# Patient Record
Sex: Female | Born: 1955 | Race: Asian | Hispanic: No | Smoking: Never smoker
Health system: Southern US, Community
[De-identification: ages and names within clinical notes are randomized; demographics above are authoritative.]

## PROBLEM LIST (undated history)

## (undated) DIAGNOSIS — N19 Unspecified kidney failure: Secondary | ICD-10-CM

## (undated) DIAGNOSIS — J302 Other seasonal allergic rhinitis: Secondary | ICD-10-CM

## (undated) DIAGNOSIS — E039 Hypothyroidism, unspecified: Secondary | ICD-10-CM

## (undated) DIAGNOSIS — M1990 Unspecified osteoarthritis, unspecified site: Secondary | ICD-10-CM

## (undated) DIAGNOSIS — E119 Type 2 diabetes mellitus without complications: Secondary | ICD-10-CM

## (undated) DIAGNOSIS — I10 Essential (primary) hypertension: Secondary | ICD-10-CM

## (undated) DIAGNOSIS — K219 Gastro-esophageal reflux disease without esophagitis: Secondary | ICD-10-CM

## (undated) DIAGNOSIS — G709 Myoneural disorder, unspecified: Secondary | ICD-10-CM

## (undated) HISTORY — DX: Unspecified kidney failure: N19

## (undated) HISTORY — PX: CHOLECYSTECTOMY: SHX55

---

## 2011-11-26 ENCOUNTER — Ambulatory Visit: Payer: Medicaid Other | Attending: Sports Medicine | Admitting: Physical Therapy

## 2011-11-26 DIAGNOSIS — IMO0001 Reserved for inherently not codable concepts without codable children: Secondary | ICD-10-CM | POA: Insufficient documentation

## 2011-11-26 DIAGNOSIS — M25569 Pain in unspecified knee: Secondary | ICD-10-CM | POA: Insufficient documentation

## 2011-11-26 DIAGNOSIS — R269 Unspecified abnormalities of gait and mobility: Secondary | ICD-10-CM | POA: Insufficient documentation

## 2011-11-26 DIAGNOSIS — M25669 Stiffness of unspecified knee, not elsewhere classified: Secondary | ICD-10-CM | POA: Insufficient documentation

## 2011-12-04 ENCOUNTER — Ambulatory Visit: Payer: Medicaid Other | Admitting: Physical Therapy

## 2011-12-04 ENCOUNTER — Ambulatory Visit: Payer: Medicaid Other | Attending: Sports Medicine | Admitting: Physical Therapy

## 2011-12-04 DIAGNOSIS — IMO0001 Reserved for inherently not codable concepts without codable children: Secondary | ICD-10-CM | POA: Insufficient documentation

## 2011-12-04 DIAGNOSIS — R269 Unspecified abnormalities of gait and mobility: Secondary | ICD-10-CM | POA: Insufficient documentation

## 2011-12-04 DIAGNOSIS — M25569 Pain in unspecified knee: Secondary | ICD-10-CM | POA: Insufficient documentation

## 2011-12-04 DIAGNOSIS — M25669 Stiffness of unspecified knee, not elsewhere classified: Secondary | ICD-10-CM | POA: Insufficient documentation

## 2012-08-25 ENCOUNTER — Other Ambulatory Visit (HOSPITAL_COMMUNITY): Payer: Self-pay | Admitting: Family Medicine

## 2012-08-25 ENCOUNTER — Ambulatory Visit (HOSPITAL_COMMUNITY)
Admission: RE | Admit: 2012-08-25 | Discharge: 2012-08-25 | Disposition: A | Discharge status: Home / Self Care | Payer: Medicaid Other | Source: Ambulatory Visit | Attending: Family Medicine | Admitting: Family Medicine

## 2012-08-25 DIAGNOSIS — R9431 Abnormal electrocardiogram [ECG] [EKG]: Secondary | ICD-10-CM

## 2012-08-25 DIAGNOSIS — I369 Nonrheumatic tricuspid valve disorder, unspecified: Secondary | ICD-10-CM

## 2012-08-25 NOTE — Progress Notes (Signed)
  Echocardiogram 2D Echocardiogram has been performed.  Evelyn Anderson 08/25/2012, 4:12 PM

## 2012-09-16 ENCOUNTER — Ambulatory Visit (HOSPITAL_COMMUNITY)
Admission: RE | Admit: 2012-09-16 | Discharge: 2012-09-16 | Disposition: A | Discharge status: Home / Self Care | Payer: Medicaid Other | Source: Ambulatory Visit | Attending: Orthopedic Surgery | Admitting: Orthopedic Surgery

## 2012-09-16 ENCOUNTER — Encounter (HOSPITAL_COMMUNITY)
Admission: RE | Admit: 2012-09-16 | Discharge: 2012-09-16 | Disposition: A | Discharge status: Home / Self Care | Payer: Medicaid Other | Source: Ambulatory Visit | Attending: Orthopedic Surgery | Admitting: Orthopedic Surgery

## 2012-09-16 ENCOUNTER — Encounter (HOSPITAL_COMMUNITY): Payer: Self-pay

## 2012-09-16 ENCOUNTER — Encounter (HOSPITAL_COMMUNITY): Payer: Self-pay | Admitting: Pharmacy Technician

## 2012-09-16 DIAGNOSIS — Z01818 Encounter for other preprocedural examination: Secondary | ICD-10-CM | POA: Insufficient documentation

## 2012-09-16 DIAGNOSIS — Z01812 Encounter for preprocedural laboratory examination: Secondary | ICD-10-CM | POA: Insufficient documentation

## 2012-09-16 DIAGNOSIS — I10 Essential (primary) hypertension: Secondary | ICD-10-CM | POA: Insufficient documentation

## 2012-09-16 HISTORY — DX: Gastro-esophageal reflux disease without esophagitis: K21.9

## 2012-09-16 HISTORY — DX: Unspecified osteoarthritis, unspecified site: M19.90

## 2012-09-16 HISTORY — DX: Hypothyroidism, unspecified: E03.9

## 2012-09-16 HISTORY — DX: Myoneural disorder, unspecified: G70.9

## 2012-09-16 HISTORY — DX: Other seasonal allergic rhinitis: J30.2

## 2012-09-16 HISTORY — DX: Essential (primary) hypertension: I10

## 2012-09-16 HISTORY — DX: Type 2 diabetes mellitus without complications: E11.9

## 2012-09-16 LAB — URINALYSIS, ROUTINE W REFLEX MICROSCOPIC
Bilirubin Urine: NEGATIVE
Ketones, ur: NEGATIVE mg/dL
Protein, ur: 100 mg/dL — AB
Specific Gravity, Urine: 1.005 (ref 1.005–1.030)
Urobilinogen, UA: 0.2 mg/dL (ref 0.0–1.0)

## 2012-09-16 LAB — APTT: aPTT: 28 seconds (ref 24–37)

## 2012-09-16 LAB — CBC
HCT: 32.8 % — ABNORMAL LOW (ref 36.0–46.0)
Hemoglobin: 11.4 g/dL — ABNORMAL LOW (ref 12.0–15.0)
MCV: 80.2 fL (ref 78.0–100.0)
Platelets: 267 10*3/uL (ref 150–400)
RBC: 4.09 MIL/uL (ref 3.87–5.11)
WBC: 9.6 10*3/uL (ref 4.0–10.5)

## 2012-09-16 LAB — BASIC METABOLIC PANEL
CO2: 27 mEq/L (ref 19–32)
Calcium: 9.6 mg/dL (ref 8.4–10.5)
Chloride: 96 mEq/L (ref 96–112)
Potassium: 3.3 mEq/L — ABNORMAL LOW (ref 3.5–5.1)
Sodium: 133 mEq/L — ABNORMAL LOW (ref 135–145)

## 2012-09-16 LAB — URINE MICROSCOPIC-ADD ON

## 2012-09-16 LAB — SURGICAL PCR SCREEN: Staphylococcus aureus: NEGATIVE

## 2012-09-16 NOTE — Progress Notes (Signed)
Daughter, Evelyn Anderson  [phone] present for interview this am and interpretor from [employer] as noted. Clear liquid sheet instructions given to daughter with instructions NO MILK PRODUCTS that AM, also if feels blood sugar is low that AM then come to hospital. Spoke with daughter at 1200 who stated her mother is eating lunch now and "feels fine".  Clydie Braun from Dr Boneta Lucks called and stated Dr Charlann Boxer os OK with daughter being interpretor day of surgery.  Faxed u/a with micro, bmet to Dr Charlann Boxer through [facility]

## 2012-09-16 NOTE — Progress Notes (Signed)
OV DR Clarisa Schools on chart 08/24/12

## 2012-09-16 NOTE — Progress Notes (Signed)
CBC, CMET, EKG, clearance with note Dr Evelene Croon 08/24/12 chart, eccho 4/14 chart

## 2012-09-16 NOTE — Patient Instructions (Addendum)
20 Evelyn Anderson  09/16/2012   Your procedure is scheduled on:  09/23/12  Wednesday  Report to [facility] at  1030     AM.  Call this number if you have problems the morning of surgery: [phone]       Remember: TAKE ONE HALF DOSAGE INSULIN Tuesday NIGHT.    EAT A SNACK BEFORE BEDTIME OR MIDNIGHT Tuesday NIGHT  Do not eat food  :After Midnight. Tuesday NIGHT.  May have clear liquids Wednesday MORNING UNTIL 0600, then nothing by mouth   Take these medicines the morning of surgery with A SIP OF WATER: Zantac                        MAY TAKE NORCO(pain medicine) if needed   DO NOT TAKE ANY BLOOD SUGAR MEDICATION Wednesday MORNING  Contacts, dentures or partial plates can not be worn to surgery  Leave suitcase in the car. After surgery it may be brought to your room.  For patients admitted to the hospital, checkout time is 11:00 AM day of  discharge.             SPECIAL INSTRUCTIONS- SEE [place] PREPARING FOR SURGERY INSTRUCTION SHEET-     DO NOT WEAR JEWELRY, LOTIONS, POWDERS, OR PERFUMES.  WOMEN-- DO NOT SHAVE LEGS OR UNDERARMS FOR 12 HOURS BEFORE SHOWERS. MEN MAY SHAVE FACE.  Patients discharged the day of surgery will not be allowed to drive home. IF going home the day of surgery, you must have a driver and someone to stay with you for the first 24 hours  Name and phone number of your driver:      admission                                                                  Please read over the following fact sheets that you were given: MRSA Information, Incentive Spirometry Sheet, Blood Transfusion Sheet  Information                                                                                   Garnett Nunziata  PST [phone]  [phone]                 FAILURE TO FOLLOW THESE INSTRUCTIONS MAY RESULT IN  CANCELLATION   OF YOUR SURGERY                                                  Patient Signature _____________________________

## 2012-09-17 NOTE — Progress Notes (Signed)
Received fax from Dr Boneta Lucks office that Cipro had been called in to drug store

## 2012-09-20 NOTE — H&P (Signed)
TOTAL KNEE ADMISSION H&P  Patient is being admitted for left total knee arthroplasty.  Subjective:  Chief Complaint:   Left knee OA / pain.  HPI: Evelyn Anderson, 57 y.o. female, has a history of pain and functional disability in the left knee due to arthritis and has failed non-surgical conservative treatments for greater than 12 weeks to includeNSAID's and/or analgesics, corticosteriod injections, use of assistive devices and activity modification.  Onset of symptoms was gradual, starting 2 years ago with gradually worsening course since that time. The patient noted no past surgery on the left knee(s).  Patient currently rates pain in the left knee(s) at 8 out of 10 with activity. Patient has worsening of pain with activity and weight bearing, pain that interferes with activities of daily living, pain with passive range of motion, crepitus and joint swelling.  Patient has evidence of periarticular osteophytes and joint space narrowing by imaging studies. There is no active infection. Risks, benefits and expectations were discussed with the patient. Patient understand the risks, benefits and expectations and wishes to proceed with surgery.   D/C Plans:   Home with HHPT/SNF  Post-op Meds:   No Rx given   Tranexamic Acid:   To be given  Decadron:    Not to be given - DM  FYI:   ASA post-op    Past Medical History  Diagnosis Date  . Hypertension   . Hypothyroidism   . Diabetes mellitus without complication   . Seasonal allergies   . GERD (gastroesophageal reflux disease)   . Arthritis   . Neuromuscular disorder     peripheral neuropathy    Past Surgical History  Procedure Laterality Date  . Cholecystectomy      No prescriptions prior to admission   Allergies  Allergen Reactions  . Other     Cant digest glutin    History  Substance Use Topics  . Smoking status: Never Smoker   . Smokeless tobacco: Never Used  . Alcohol Use: No    No family history on file.   Review of  Systems  Constitutional: Negative.   HENT: Negative.   Eyes: Negative.   Respiratory: Negative.   Cardiovascular: Negative.   Gastrointestinal: Positive for heartburn.  Genitourinary: Negative.   Musculoskeletal: Positive for joint pain.  Skin: Negative.   Neurological: Positive for sensory change.  Endo/Heme/Allergies: Positive for environmental allergies.  Psychiatric/Behavioral: Negative.     Objective:  Physical Exam  Constitutional: She is oriented to person, place, and time. She appears well-developed and well-nourished.  HENT:  Head: Normocephalic and atraumatic.  Mouth/Throat: Oropharynx is clear and moist.  Eyes: Pupils are equal, round, and reactive to light.  Neck: Neck supple. No JVD present. No tracheal deviation present. No thyromegaly present.  Cardiovascular: Normal rate, regular rhythm, normal heart sounds and intact distal pulses.   Respiratory: Effort normal and breath sounds normal. No stridor. No respiratory distress. She has no wheezes.  GI: Soft. There is no tenderness. There is no guarding.  Musculoskeletal:       Left knee: She exhibits decreased range of motion, swelling and bony tenderness. She exhibits no effusion, no ecchymosis, no laceration and no erythema. Tenderness found.  Lymphadenopathy:    She has no cervical adenopathy.  Neurological: She is alert and oriented to person, place, and time.  Skin: Skin is warm and dry.  Psychiatric: She has a normal mood and affect.    Imaging Review Plain radiographs demonstrate severe degenerative joint disease of the left knee(s).  The overall alignment is neutral. The bone quality appears to be good for age and reported activity level.  Assessment/Plan:  End stage arthritis, left knee   The patient history, physical examination, clinical judgment of the provider and imaging studies are consistent with end stage degenerative joint disease of the left knee(s) and total knee arthroplasty is deemed  medically necessary. The treatment options including medical management, injection therapy arthroscopy and arthroplasty were discussed at length. The risks and benefits of total knee arthroplasty were presented and reviewed. The risks due to aseptic loosening, infection, stiffness, patella tracking problems, thromboembolic complications and other imponderables were discussed. The patient acknowledged the explanation, agreed to proceed with the plan and consent was signed. Patient is being admitted for inpatient treatment for surgery, pain control, PT, OT, prophylactic antibiotics, VTE prophylaxis, progressive ambulation and ADL's and discharge planning. The patient is planning to be discharged home with home health services.    Anastasio Auerbach Monnie Anspach   PAC  09/20/2012, 10:21 AM

## 2012-09-23 ENCOUNTER — Inpatient Hospital Stay (HOSPITAL_COMMUNITY)
Admission: RE | Admit: 2012-09-23 | Discharge: 2012-09-26 | DRG: 470 | Disposition: A | Discharge status: Skilled Nursing Facility | Payer: Medicaid Other | Source: Ambulatory Visit | Attending: Orthopedic Surgery | Admitting: Orthopedic Surgery

## 2012-09-23 ENCOUNTER — Encounter (HOSPITAL_COMMUNITY)
Admission: RE | Disposition: A | Discharge status: Skilled Nursing Facility | Payer: Self-pay | Source: Ambulatory Visit | Attending: Orthopedic Surgery

## 2012-09-23 ENCOUNTER — Encounter (HOSPITAL_COMMUNITY): Payer: Self-pay | Admitting: General Practice

## 2012-09-23 ENCOUNTER — Inpatient Hospital Stay (HOSPITAL_COMMUNITY): Payer: Medicaid Other | Admitting: Anesthesiology

## 2012-09-23 ENCOUNTER — Encounter (HOSPITAL_COMMUNITY): Payer: Self-pay | Admitting: Anesthesiology

## 2012-09-23 DIAGNOSIS — Z96652 Presence of left artificial knee joint: Secondary | ICD-10-CM

## 2012-09-23 DIAGNOSIS — M1710 Unilateral primary osteoarthritis, unspecified knee: Principal | ICD-10-CM | POA: Diagnosis present

## 2012-09-23 DIAGNOSIS — D62 Acute posthemorrhagic anemia: Secondary | ICD-10-CM | POA: Diagnosis not present

## 2012-09-23 DIAGNOSIS — K219 Gastro-esophageal reflux disease without esophagitis: Secondary | ICD-10-CM | POA: Diagnosis present

## 2012-09-23 DIAGNOSIS — I10 Essential (primary) hypertension: Secondary | ICD-10-CM | POA: Diagnosis present

## 2012-09-23 DIAGNOSIS — E119 Type 2 diabetes mellitus without complications: Secondary | ICD-10-CM | POA: Diagnosis present

## 2012-09-23 DIAGNOSIS — E669 Obesity, unspecified: Secondary | ICD-10-CM | POA: Diagnosis present

## 2012-09-23 DIAGNOSIS — Z96659 Presence of unspecified artificial knee joint: Secondary | ICD-10-CM

## 2012-09-23 DIAGNOSIS — E039 Hypothyroidism, unspecified: Secondary | ICD-10-CM | POA: Diagnosis present

## 2012-09-23 DIAGNOSIS — D500 Iron deficiency anemia secondary to blood loss (chronic): Secondary | ICD-10-CM | POA: Diagnosis not present

## 2012-09-23 HISTORY — PX: TOTAL KNEE ARTHROPLASTY: SHX125

## 2012-09-23 LAB — GLUCOSE, CAPILLARY
Glucose-Capillary: 172 mg/dL — ABNORMAL HIGH (ref 70–99)
Glucose-Capillary: 188 mg/dL — ABNORMAL HIGH (ref 70–99)

## 2012-09-23 LAB — TYPE AND SCREEN
ABO/RH(D): B POS
Antibody Screen: NEGATIVE

## 2012-09-23 SURGERY — ARTHROPLASTY, KNEE, TOTAL
Anesthesia: General | Site: Knee | Laterality: Left | Wound class: Clean

## 2012-09-23 MED ORDER — BACID PO TABS
1.0000 | ORAL_TABLET | Freq: Three times a day (TID) | ORAL | Status: DC
  Administered 2012-09-24 (×3): 1 via ORAL
  Filled 2012-09-23 (×8): qty 1

## 2012-09-23 MED ORDER — LINAGLIPTIN 5 MG PO TABS
5.0000 mg | ORAL_TABLET | Freq: Every day | ORAL | Status: DC
  Administered 2012-09-24 – 2012-09-26 (×2): 5 mg via ORAL
  Filled 2012-09-23 (×5): qty 1

## 2012-09-23 MED ORDER — LOSARTAN POTASSIUM-HCTZ 50-12.5 MG PO TABS: 1.0000 | ORAL_TABLET | Freq: Every day | ORAL | Status: DC

## 2012-09-23 MED ORDER — HYDROMORPHONE HCL PF 1 MG/ML IJ SOLN
0.5000 mg | INTRAMUSCULAR | Status: DC | PRN
  Filled 2012-09-23: qty 1

## 2012-09-23 MED ORDER — HYDROCHLOROTHIAZIDE 12.5 MG PO CAPS
12.5000 mg | ORAL_CAPSULE | Freq: Every day | ORAL | Status: DC
  Administered 2012-09-24 – 2012-09-26 (×3): 12.5 mg via ORAL
  Filled 2012-09-23 (×3): qty 1

## 2012-09-23 MED ORDER — PROMETHAZINE HCL 25 MG/ML IJ SOLN: 6.2500 mg | INTRAMUSCULAR | Status: DC | PRN

## 2012-09-23 MED ORDER — FAMOTIDINE 40 MG PO TABS
40.0000 mg | ORAL_TABLET | Freq: Every day | ORAL | Status: DC
  Administered 2012-09-24 – 2012-09-25 (×2): 40 mg via ORAL
  Filled 2012-09-23 (×4): qty 1

## 2012-09-23 MED ORDER — ONDANSETRON HCL 4 MG PO TABS
4.0000 mg | ORAL_TABLET | Freq: Four times a day (QID) | ORAL | Status: DC | PRN
  Administered 2012-09-24: 4 mg via ORAL
  Filled 2012-09-23: qty 1

## 2012-09-23 MED ORDER — MIDAZOLAM HCL 5 MG/5ML IJ SOLN
INTRAMUSCULAR | Status: DC | PRN
  Administered 2012-09-23: 1 mg via INTRAVENOUS
  Administered 2012-09-23: .5 mg via INTRAVENOUS

## 2012-09-23 MED ORDER — PHENOL 1.4 % MT LIQD: 1.0000 | OROMUCOSAL | Status: DC | PRN

## 2012-09-23 MED ORDER — ACETAMINOPHEN 10 MG/ML IV SOLN
INTRAVENOUS | Status: DC | PRN
  Administered 2012-09-23: 1000 mg via INTRAVENOUS

## 2012-09-23 MED ORDER — ASPIRIN EC 325 MG PO TBEC
325.0000 mg | DELAYED_RELEASE_TABLET | Freq: Two times a day (BID) | ORAL | Status: DC
  Administered 2012-09-24 – 2012-09-26 (×5): 325 mg via ORAL
  Filled 2012-09-23 (×7): qty 1

## 2012-09-23 MED ORDER — DIPHENHYDRAMINE HCL 25 MG PO CAPS
25.0000 mg | ORAL_CAPSULE | Freq: Four times a day (QID) | ORAL | Status: DC | PRN
  Administered 2012-09-25: 25 mg via ORAL
  Filled 2012-09-23: qty 1

## 2012-09-23 MED ORDER — KETOROLAC TROMETHAMINE 30 MG/ML IJ SOLN
INTRAMUSCULAR | Status: DC | PRN
  Administered 2012-09-23: 30 mg via INTRAVENOUS

## 2012-09-23 MED ORDER — ZOLPIDEM TARTRATE 5 MG PO TABS
5.0000 mg | ORAL_TABLET | Freq: Every evening | ORAL | Status: DC | PRN
  Administered 2012-09-25 (×2): 5 mg via ORAL
  Filled 2012-09-23 (×2): qty 1

## 2012-09-23 MED ORDER — SODIUM CHLORIDE 0.9 % IJ SOLN
INTRAMUSCULAR | Status: DC | PRN
  Administered 2012-09-23: 15:00:00

## 2012-09-23 MED ORDER — NEOSTIGMINE METHYLSULFATE 1 MG/ML IJ SOLN
INTRAMUSCULAR | Status: DC | PRN
  Administered 2012-09-23: 3 mg via INTRAVENOUS

## 2012-09-23 MED ORDER — CEFAZOLIN SODIUM-DEXTROSE 2-3 GM-% IV SOLR
2.0000 g | Freq: Four times a day (QID) | INTRAVENOUS | Status: AC
  Administered 2012-09-23 – 2012-09-24 (×2): 2 g via INTRAVENOUS
  Filled 2012-09-23 (×2): qty 50

## 2012-09-23 MED ORDER — LORATADINE 10 MG PO TABS
10.0000 mg | ORAL_TABLET | Freq: Every day | ORAL | Status: DC
  Administered 2012-09-23 – 2012-09-26 (×4): 10 mg via ORAL
  Filled 2012-09-23 (×4): qty 1

## 2012-09-23 MED ORDER — DOCUSATE SODIUM 100 MG PO CAPS
100.0000 mg | ORAL_CAPSULE | Freq: Two times a day (BID) | ORAL | Status: DC
  Administered 2012-09-24 – 2012-09-26 (×5): 100 mg via ORAL

## 2012-09-23 MED ORDER — TRANEXAMIC ACID 100 MG/ML IV SOLN
1000.0000 mg | Freq: Once | INTRAVENOUS | Status: AC
  Administered 2012-09-23: 1000 mg via INTRAVENOUS
  Filled 2012-09-23: qty 10

## 2012-09-23 MED ORDER — MEPERIDINE HCL 50 MG/ML IJ SOLN: 6.2500 mg | INTRAMUSCULAR | Status: DC | PRN

## 2012-09-23 MED ORDER — HYDROXYZINE HCL 10 MG PO TABS
10.0000 mg | ORAL_TABLET | Freq: Every day | ORAL | Status: DC | PRN
  Administered 2012-09-24 – 2012-09-25 (×2): 10 mg via ORAL
  Filled 2012-09-23 (×2): qty 1

## 2012-09-23 MED ORDER — FENTANYL CITRATE 0.05 MG/ML IJ SOLN
INTRAMUSCULAR | Status: DC | PRN
  Administered 2012-09-23: 50 ug via INTRAVENOUS
  Administered 2012-09-23: 100 ug via INTRAVENOUS
  Administered 2012-09-23 (×2): 50 ug via INTRAVENOUS
  Administered 2012-09-23: 100 ug via INTRAVENOUS

## 2012-09-23 MED ORDER — LEVOTHYROXINE SODIUM 100 MCG PO TABS
100.0000 ug | ORAL_TABLET | Freq: Every day | ORAL | Status: DC
  Administered 2012-09-24 – 2012-09-26 (×3): 100 ug via ORAL
  Filled 2012-09-23 (×4): qty 1

## 2012-09-23 MED ORDER — ONDANSETRON HCL 4 MG/2ML IJ SOLN
INTRAMUSCULAR | Status: DC | PRN
  Administered 2012-09-23 (×2): 2 mg via INTRAVENOUS

## 2012-09-23 MED ORDER — OMEGA-3-ACID ETHYL ESTERS 1 G PO CAPS
1.0000 g | ORAL_CAPSULE | Freq: Every day | ORAL | Status: DC
  Administered 2012-09-23 – 2012-09-26 (×4): 1 g via ORAL
  Filled 2012-09-23 (×4): qty 1

## 2012-09-23 MED ORDER — SUCCINYLCHOLINE CHLORIDE 20 MG/ML IJ SOLN
INTRAMUSCULAR | Status: DC | PRN
  Administered 2012-09-23: 100 mg via INTRAVENOUS

## 2012-09-23 MED ORDER — VITAMIN D3 25 MCG (1000 UNIT) PO TABS
5000.0000 [IU] | ORAL_TABLET | Freq: Every day | ORAL | Status: DC
  Administered 2012-09-24 – 2012-09-26 (×3): 5000 [IU] via ORAL
  Filled 2012-09-23 (×4): qty 5

## 2012-09-23 MED ORDER — POLYETHYLENE GLYCOL 3350 17 G PO PACK
17.0000 g | PACK | Freq: Two times a day (BID) | ORAL | Status: DC
  Administered 2012-09-24 – 2012-09-26 (×5): 17 g via ORAL

## 2012-09-23 MED ORDER — SODIUM CHLORIDE 0.9 % IV SOLN
INTRAVENOUS | Status: DC
  Administered 2012-09-23 – 2012-09-24 (×2): via INTRAVENOUS
  Filled 2012-09-23 (×6): qty 1000

## 2012-09-23 MED ORDER — ONDANSETRON HCL 4 MG/2ML IJ SOLN
4.0000 mg | Freq: Four times a day (QID) | INTRAMUSCULAR | Status: DC | PRN
  Filled 2012-09-23: qty 2

## 2012-09-23 MED ORDER — SIMVASTATIN 40 MG PO TABS
40.0000 mg | ORAL_TABLET | Freq: Every day | ORAL | Status: DC
  Administered 2012-09-24 – 2012-09-25 (×2): 40 mg via ORAL
  Filled 2012-09-23 (×4): qty 1

## 2012-09-23 MED ORDER — VITAMIN D-3 125 MCG (5000 UT) PO TABS: 1.0000 | ORAL_TABLET | Freq: Every day | ORAL | Status: DC

## 2012-09-23 MED ORDER — FLEET ENEMA 7-19 GM/118ML RE ENEM: 1.0000 | ENEMA | Freq: Once | RECTAL | Status: AC | PRN

## 2012-09-23 MED ORDER — CALCIUM D-GLUCARATE 500 MG PO CAPS: 2.0000 | ORAL_CAPSULE | Freq: Three times a day (TID) | ORAL | Status: DC

## 2012-09-23 MED ORDER — GLIPIZIDE 10 MG PO TABS
10.0000 mg | ORAL_TABLET | Freq: Two times a day (BID) | ORAL | Status: DC
  Administered 2012-09-24 – 2012-09-26 (×5): 10 mg via ORAL
  Filled 2012-09-23 (×7): qty 1

## 2012-09-23 MED ORDER — BUPIVACAINE-EPINEPHRINE PF 0.25-1:200000 % IJ SOLN
INTRAMUSCULAR | Status: DC | PRN
  Administered 2012-09-23: 30 mL

## 2012-09-23 MED ORDER — HYDROCODONE-ACETAMINOPHEN 7.5-325 MG PO TABS
1.0000 | ORAL_TABLET | ORAL | Status: DC
  Administered 2012-09-23: 1 via ORAL
  Administered 2012-09-24 – 2012-09-26 (×12): 2 via ORAL
  Filled 2012-09-23 (×7): qty 2
  Filled 2012-09-23: qty 1
  Filled 2012-09-23 (×5): qty 2

## 2012-09-23 MED ORDER — ALUM & MAG HYDROXIDE-SIMETH 200-200-20 MG/5ML PO SUSP: 30.0000 mL | ORAL | Status: DC | PRN

## 2012-09-23 MED ORDER — CELECOXIB 200 MG PO CAPS
200.0000 mg | ORAL_CAPSULE | Freq: Two times a day (BID) | ORAL | Status: DC
  Administered 2012-09-24 – 2012-09-25 (×3): 200 mg via ORAL
  Filled 2012-09-23 (×5): qty 1

## 2012-09-23 MED ORDER — HYDRALAZINE HCL 20 MG/ML IJ SOLN
5.0000 mg | INTRAMUSCULAR | Status: DC | PRN
  Administered 2012-09-23 (×2): 5 mg via INTRAVENOUS

## 2012-09-23 MED ORDER — LIDOCAINE HCL (CARDIAC) 20 MG/ML IV SOLN
INTRAVENOUS | Status: DC | PRN
  Administered 2012-09-23: 50 mg via INTRAVENOUS

## 2012-09-23 MED ORDER — MENTHOL 3 MG MT LOZG: 1.0000 | LOZENGE | OROMUCOSAL | Status: DC | PRN

## 2012-09-23 MED ORDER — INSULIN ASPART 100 UNIT/ML ~~LOC~~ SOLN
0.0000 [IU] | Freq: Three times a day (TID) | SUBCUTANEOUS | Status: DC
  Administered 2012-09-23: 5 [IU] via SUBCUTANEOUS
  Administered 2012-09-24: 3 [IU] via SUBCUTANEOUS
  Administered 2012-09-24: 5 [IU] via SUBCUTANEOUS
  Administered 2012-09-24: 2 [IU] via SUBCUTANEOUS
  Administered 2012-09-25: 3 [IU] via SUBCUTANEOUS
  Administered 2012-09-25: 5 [IU] via SUBCUTANEOUS
  Administered 2012-09-25: 3 [IU] via SUBCUTANEOUS

## 2012-09-23 MED ORDER — METOCLOPRAMIDE HCL 10 MG PO TABS: 5.0000 mg | ORAL_TABLET | Freq: Three times a day (TID) | ORAL | Status: DC | PRN

## 2012-09-23 MED ORDER — CLONIDINE HCL 0.1 MG PO TABS
0.1000 mg | ORAL_TABLET | Freq: Every evening | ORAL | Status: DC
  Administered 2012-09-23 – 2012-09-25 (×3): 0.1 mg via ORAL
  Filled 2012-09-23 (×4): qty 1

## 2012-09-23 MED ORDER — METOCLOPRAMIDE HCL 5 MG/ML IJ SOLN
5.0000 mg | Freq: Three times a day (TID) | INTRAMUSCULAR | Status: DC | PRN
  Filled 2012-09-23: qty 2

## 2012-09-23 MED ORDER — PROPOFOL 10 MG/ML IV BOLUS
INTRAVENOUS | Status: DC | PRN
  Administered 2012-09-23: 50 mg via INTRAVENOUS
  Administered 2012-09-23: 200 mg via INTRAVENOUS

## 2012-09-23 MED ORDER — GLYCOPYRROLATE 0.2 MG/ML IJ SOLN
INTRAMUSCULAR | Status: DC | PRN
  Administered 2012-09-23: 0.4 mg via INTRAVENOUS

## 2012-09-23 MED ORDER — BUPIVACAINE LIPOSOME 1.3 % IJ SUSP
20.0000 mL | Freq: Once | INTRAMUSCULAR | Status: DC
  Filled 2012-09-23: qty 20

## 2012-09-23 MED ORDER — L-GLUTAMINE 500 MG PO TABS: 500.0000 mg | ORAL_TABLET | Freq: Every day | ORAL | Status: DC

## 2012-09-23 MED ORDER — CISATRACURIUM BESYLATE (PF) 10 MG/5ML IV SOLN
INTRAVENOUS | Status: DC | PRN
  Administered 2012-09-23: 6 mg via INTRAVENOUS

## 2012-09-23 MED ORDER — DICYCLOMINE HCL 10 MG PO CAPS
10.0000 mg | ORAL_CAPSULE | Freq: Two times a day (BID) | ORAL | Status: DC
  Administered 2012-09-24 – 2012-09-26 (×5): 10 mg via ORAL
  Filled 2012-09-23 (×7): qty 1

## 2012-09-23 MED ORDER — LACTATED RINGERS IV SOLN
INTRAVENOUS | Status: DC
  Administered 2012-09-23: 16:00:00 via INTRAVENOUS
  Administered 2012-09-23: 1000 mL via INTRAVENOUS
  Administered 2012-09-23: 14:00:00 via INTRAVENOUS

## 2012-09-23 MED ORDER — CEFAZOLIN SODIUM-DEXTROSE 2-3 GM-% IV SOLR
2.0000 g | INTRAVENOUS | Status: AC
  Administered 2012-09-23: 2 g via INTRAVENOUS

## 2012-09-23 MED ORDER — INSULIN GLARGINE 100 UNIT/ML ~~LOC~~ SOLN
10.0000 [IU] | Freq: Every day | SUBCUTANEOUS | Status: DC
  Administered 2012-09-24 – 2012-09-25 (×2): 10 [IU] via SUBCUTANEOUS
  Filled 2012-09-23 (×5): qty 0.1

## 2012-09-23 MED ORDER — OMEGA-3 FATTY ACIDS 1000 MG PO CAPS: 1.0000 g | ORAL_CAPSULE | Freq: Every day | ORAL | Status: DC

## 2012-09-23 MED ORDER — FERROUS SULFATE 325 (65 FE) MG PO TABS
325.0000 mg | ORAL_TABLET | Freq: Three times a day (TID) | ORAL | Status: DC
  Administered 2012-09-24 – 2012-09-26 (×7): 325 mg via ORAL
  Filled 2012-09-23 (×11): qty 1

## 2012-09-23 MED ORDER — HYDROMORPHONE HCL PF 1 MG/ML IJ SOLN
0.2500 mg | INTRAMUSCULAR | Status: DC | PRN
  Administered 2012-09-23: 0.5 mg via INTRAVENOUS
  Administered 2012-09-23: 1 mg via INTRAVENOUS
  Administered 2012-09-23: 0.5 mg via INTRAVENOUS

## 2012-09-23 MED ORDER — METHOCARBAMOL 500 MG PO TABS
500.0000 mg | ORAL_TABLET | Freq: Four times a day (QID) | ORAL | Status: DC | PRN
  Administered 2012-09-23 – 2012-09-26 (×6): 500 mg via ORAL
  Filled 2012-09-23 (×6): qty 1

## 2012-09-23 MED ORDER — LOSARTAN POTASSIUM 50 MG PO TABS
50.0000 mg | ORAL_TABLET | Freq: Every day | ORAL | Status: DC
Start: 2012-09-24 — End: 2012-09-26
  Administered 2012-09-24 – 2012-09-26 (×3): 50 mg via ORAL
  Filled 2012-09-23 (×3): qty 1

## 2012-09-23 MED ORDER — METHOCARBAMOL 100 MG/ML IJ SOLN
500.0000 mg | Freq: Four times a day (QID) | INTRAVENOUS | Status: DC | PRN
  Administered 2012-09-23: 500 mg via INTRAVENOUS
  Filled 2012-09-23: qty 5

## 2012-09-23 MED ORDER — LACTATED RINGERS IV SOLN: INTRAVENOUS | Status: DC

## 2012-09-23 MED ORDER — BISACODYL 10 MG RE SUPP: 10.0000 mg | Freq: Every day | RECTAL | Status: DC | PRN

## 2012-09-23 SURGICAL SUPPLY — 56 items
BAG ZIPLOCK 12X15 (MISCELLANEOUS) ×2 IMPLANT
BANDAGE ELASTIC 6 VELCRO ST LF (GAUZE/BANDAGES/DRESSINGS) ×2 IMPLANT
BANDAGE ESMARK 6X9 LF (GAUZE/BANDAGES/DRESSINGS) ×1 IMPLANT
BLADE SAW SGTL 13.0X1.19X90.0M (BLADE) ×2 IMPLANT
BNDG ESMARK 6X9 LF (GAUZE/BANDAGES/DRESSINGS) ×2
BONE CEMENT GENTAMICIN (Cement) ×2 IMPLANT
BOWL SMART MIX CTS (DISPOSABLE) ×2 IMPLANT
CEMENT BONE GENTAMICIN 40 (Cement) ×1 IMPLANT
CLOTH BEACON ORANGE TIMEOUT ST (SAFETY) ×2 IMPLANT
CUFF TOURN SGL QUICK 34 (TOURNIQUET CUFF) ×1
CUFF TRNQT CYL 34X4X40X1 (TOURNIQUET CUFF) ×1 IMPLANT
DECANTER SPIKE VIAL GLASS SM (MISCELLANEOUS) ×2 IMPLANT
DERMABOND ADVANCED (GAUZE/BANDAGES/DRESSINGS) ×1
DERMABOND ADVANCED .7 DNX12 (GAUZE/BANDAGES/DRESSINGS) ×1 IMPLANT
DRAPE EXTREMITY T 121X128X90 (DRAPE) ×2 IMPLANT
DRAPE POUCH INSTRU U-SHP 10X18 (DRAPES) ×2 IMPLANT
DRAPE U-SHAPE 47X51 STRL (DRAPES) ×2 IMPLANT
DRSG AQUACEL AG ADV 3.5X10 (GAUZE/BANDAGES/DRESSINGS) ×2 IMPLANT
DRSG OPSITE POSTOP 3X4 (GAUZE/BANDAGES/DRESSINGS) ×2 IMPLANT
DRSG TEGADERM 4X4.75 (GAUZE/BANDAGES/DRESSINGS) ×2 IMPLANT
DURAPREP 26ML APPLICATOR (WOUND CARE) ×2 IMPLANT
ELECT REM PT RETURN 9FT ADLT (ELECTROSURGICAL) ×2
ELECTRODE REM PT RTRN 9FT ADLT (ELECTROSURGICAL) ×1 IMPLANT
EVACUATOR 1/8 PVC DRAIN (DRAIN) ×2 IMPLANT
FACESHIELD LNG OPTICON STERILE (SAFETY) ×10 IMPLANT
GAUZE SPONGE 2X2 8PLY STRL LF (GAUZE/BANDAGES/DRESSINGS) ×1 IMPLANT
GLOVE BIOGEL PI IND STRL 7.5 (GLOVE) ×1 IMPLANT
GLOVE BIOGEL PI IND STRL 8 (GLOVE) ×1 IMPLANT
GLOVE BIOGEL PI INDICATOR 7.5 (GLOVE) ×1
GLOVE BIOGEL PI INDICATOR 8 (GLOVE) ×1
GLOVE ECLIPSE 8.0 STRL XLNG CF (GLOVE) ×2 IMPLANT
GLOVE ORTHO TXT STRL SZ7.5 (GLOVE) ×4 IMPLANT
GOWN BRE IMP PREV XXLGXLNG (GOWN DISPOSABLE) ×2 IMPLANT
GOWN STRL NON-REIN LRG LVL3 (GOWN DISPOSABLE) ×2 IMPLANT
HANDPIECE INTERPULSE COAX TIP (DISPOSABLE) ×1
KIT BASIN OR (CUSTOM PROCEDURE TRAY) ×2 IMPLANT
MANIFOLD NEPTUNE II (INSTRUMENTS) ×2 IMPLANT
NDL SAFETY ECLIPSE 18X1.5 (NEEDLE) ×1 IMPLANT
NEEDLE HYPO 18GX1.5 SHARP (NEEDLE) ×1
NS IRRIG 1000ML POUR BTL (IV SOLUTION) ×4 IMPLANT
PACK TOTAL JOINT (CUSTOM PROCEDURE TRAY) ×2 IMPLANT
POSITIONER SURGICAL ARM (MISCELLANEOUS) ×2 IMPLANT
SET HNDPC FAN SPRY TIP SCT (DISPOSABLE) ×1 IMPLANT
SET PAD KNEE POSITIONER (MISCELLANEOUS) ×2 IMPLANT
SPONGE GAUZE 2X2 STER 10/PKG (GAUZE/BANDAGES/DRESSINGS) ×1
SUCTION FRAZIER 12FR DISP (SUCTIONS) ×2 IMPLANT
SUT MNCRL AB 4-0 PS2 18 (SUTURE) ×2 IMPLANT
SUT VIC AB 1 CT1 36 (SUTURE) ×2 IMPLANT
SUT VIC AB 2-0 CT1 27 (SUTURE) ×3
SUT VIC AB 2-0 CT1 TAPERPNT 27 (SUTURE) ×3 IMPLANT
SUT VLOC 180 0 24IN GS25 (SUTURE) ×2 IMPLANT
SYR 50ML LL SCALE MARK (SYRINGE) ×2 IMPLANT
TOWEL OR 17X26 10 PK STRL BLUE (TOWEL DISPOSABLE) ×4 IMPLANT
TRAY FOLEY CATH 14FRSI W/METER (CATHETERS) ×2 IMPLANT
WATER STERILE IRR 1500ML POUR (IV SOLUTION) ×2 IMPLANT
WRAP KNEE MAXI GEL POST OP (GAUZE/BANDAGES/DRESSINGS) ×2 IMPLANT

## 2012-09-23 NOTE — Anesthesia Preprocedure Evaluation (Addendum)
Anesthesia Evaluation  Patient identified by MRN, date of birth, ID band Patient awake    Reviewed: Allergy & Precautions, H&P , NPO status , Patient's Chart, lab work & pertinent test results  Airway Mallampati: II TM Distance: >3 FB Neck ROM: Full    Dental no notable dental hx.    Pulmonary neg pulmonary ROS,  breath sounds clear to auscultation  Pulmonary exam normal       Cardiovascular hypertension, Pt. on medications negative cardio ROS  Rhythm:Regular Rate:Normal     Neuro/Psych negative neurological ROS  negative psych ROS   GI/Hepatic negative GI ROS, Neg liver ROS,   Endo/Other  negative endocrine ROSdiabetesHypothyroidism   Renal/GU negative Renal ROS  negative genitourinary   Musculoskeletal negative musculoskeletal ROS (+)   Abdominal   Peds negative pediatric ROS (+)  Hematology negative hematology ROS (+)   Anesthesia Other Findings   Reproductive/Obstetrics negative OB ROS                          Anesthesia Physical Anesthesia Plan  ASA: II  Anesthesia Plan: General   Post-op Pain Management:    Induction: Intravenous  Airway Management Planned: Oral ETT  Additional Equipment:   Intra-op Plan:   Post-operative Plan: Extubation in OR  Informed Consent: I have reviewed the patients History and Physical, chart, labs and discussed the procedure including the risks, benefits and alternatives for the proposed anesthesia with the patient or authorized representative who has indicated his/her understanding and acceptance.   Dental advisory given  Plan Discussed with: CRNA  Anesthesia Plan Comments:         Anesthesia Quick Evaluation

## 2012-09-23 NOTE — Progress Notes (Signed)
PHARMACIST - PHYSICIAN ORDER COMMUNICATION  CONCERNING: P&T Medication Policy on Herbal Medications  DESCRIPTION:  This patient's order for:  Glutamine  has been noted.  This product(s) is classified as an "herbal" or natural product. Due to a lack of definitive safety studies or FDA approval, nonstandard manufacturing practices, plus the potential risk of unknown drug-drug interactions while on inpatient medications, the Pharmacy and Therapeutics Committee does not permit the use of "herbal" or natural products of this type within [facility].   ACTION TAKEN: The pharmacy department is unable to verify this order at this time and your patient has been informed of this safety policy. Please reevaluate patient's clinical condition at discharge and address if the herbal or natural product(s) should be resumed at that time.  Geoffry Paradise, PharmD, BCPS Pager: [phone] 6:15 PM Pharmacy #: [id number]

## 2012-09-23 NOTE — Transfer of Care (Signed)
Immediate Anesthesia Transfer of Care Note  Patient: Evelyn Anderson  Procedure(s) Performed: Procedure(s): LEFT TOTAL KNEE ARTHROPLASTY (Left)  Patient Location: PACU  Anesthesia Type:General  Level of Consciousness: awake, alert , oriented and patient cooperative  Airway & Oxygen Therapy: Patient Spontanous Breathing and Patient connected to face mask oxygen  Post-op Assessment: Report given to PACU RN and Post -op Vital signs reviewed and stable  Post vital signs: Reviewed and stable  Complications: No apparent anesthesia complications

## 2012-09-23 NOTE — Op Note (Signed)
NAME:  Evelyn Anderson                      MEDICAL RECORD NO.:  [id number]                             FACILITY:  [facility]      PHYSICIAN:  Madlyn Frankel. Charlann Boxer, M.D.  DATE OF BIRTH:  17-Jan-1956      DATE OF PROCEDURE:  09/23/2012                                     OPERATIVE REPORT         PREOPERATIVE DIAGNOSIS:  Left knee osteoarthritis.      POSTOPERATIVE DIAGNOSIS:  Left knee osteoarthritis.      FINDINGS:  The patient was noted to have complete loss of cartilage and   bone-on-bone arthritis with associated osteophytes in the medial and patellofemoral compartments of   the knee with a significant synovitis and associated effusion.      PROCEDURE:  Left total knee replacement.      COMPONENTS USED:  DePuy rotating platform posterior stabilized knee   system, a size 3 femur, 3 tibia, 10 mm PS insert, and 38 patellar   button.      SURGEON:  Madlyn Frankel. Charlann Boxer, M.D.      ASSISTANT:  Lanney Gins, PA-C.      ANESTHESIA:  General.      SPECIMENS:  None.      COMPLICATION:  None.      DRAINS:  One Hemovac.  EBL: <200cc      TOURNIQUET TIME:   Total Tourniquet Time Documented: Thigh (Left) - 39 minutes Total: Thigh (Left) - 39 minutes  .      The patient was stable to the recovery room.      INDICATION FOR PROCEDURE:  Evelyn Anderson is a 57 y.o. female patient of   mine.  The patient had been seen, evaluated, and treated conservatively in the   office with medication, activity modification, and injections.  The patient had   radiographic changes of bone-on-bone arthritis with endplate sclerosis and osteophytes noted.      The patient failed conservative measures including medication, injections, and activity modification, and at this point was ready for more definitive measures.   Based on the radiographic changes and failed conservative measures, the patient   decided to proceed with total knee replacement.  Risks of infection,   DVT, component failure, need for revision  surgery, postop course, and   expectations were all   discussed and reviewed.  Consent was obtained for benefit of pain   relief.      PROCEDURE IN DETAIL:  The patient was brought to the operative theater.   Once adequate anesthesia, preoperative antibiotics, 2 gm of Ancef administered, the patient was positioned supine with the left thigh tourniquet placed.  The  left lower extremity was prepped and draped in sterile fashion.  A time-   out was performed identifying the patient, planned procedure, and   extremity.      The left lower extremity was placed in the [facility] leg holder.  The leg was   exsanguinated, tourniquet elevated to 250 mmHg.  A midline incision was   made followed by median parapatellar arthrotomy.  Following initial   exposure, attention was first directed  to the patella.  Precut   measurement was noted to be 26 mm.  I resected down to 13-14 mm and used a   38 patellar button to restore patellar height as well as cover the cut   surface.      The lug holes were drilled and a metal shim was placed to protect the   patella from retractors and saw blades.      At this point, attention was now directed to the femur.  The femoral   canal was opened with a drill, irrigated to try to prevent fat emboli.  An   intramedullary rod was passed at 5 degrees valgus, 10 mm of bone was   resected off the distal femur.  Following this resection, the tibia was   subluxated anteriorly.  Using the extramedullary guide, 10 mm of bone was resected off   the proximal lateral tibia.  We confirmed the gap would be   stable medially and laterally with a 10 mm insert as well as confirmed   the cut was perpendicular in the coronal plane, checking with an alignment rod.      Once this was done, I sized the femur to be a size 3 in the anterior-   posterior dimension, chose a standard component based on medial and   lateral dimension.  The size 3 rotation block was then pinned in   position  anterior referenced using the C-clamp to set rotation.  The   anterior, posterior, and  chamfer cuts were made without difficulty nor   notching making certain that I was along the anterior cortex to help   with flexion gap stability.      The final box cut was made off the lateral aspect of distal femur.      At this point, the tibia was sized to be a size 3, the size 3 tray was   then pinned in position through the medial third of the tubercle,   drilled, and keel punched.  Trial reduction was now carried with a 3 femur,  3 tibia, a 10 mm insert, and the 38 patella botton.  The knee was brought to   extension, full extension with good flexion stability with the patella   tracking through the trochlea without application of pressure.  Given   all these findings, the trial components removed.  Final components were   opened and cement was mixed.  The knee was irrigated with normal saline   solution and pulse lavage.  The synovial lining was   then injected with 0.25% Marcaine with epinephrine and 1 cc of Toradol,   total of 61 cc.      The knee was irrigated.  Final implants were then cemented onto clean and   dried cut surfaces of bone with the knee brought to extension with a 10 mm trial insert.      Once the cement had fully cured, the excess cement was removed   throughout the knee.  I confirmed I was satisfied with the range of   motion and stability, and the final 10 mm PS insert was chosen.  It was   placed into the knee.      The tourniquet had been let down at 38 minutes.  No significant   hemostasis required.  The medium Hemovac drain was placed deep.  The   extensor mechanism was then reapproximated using #1 Vicryl with the knee   in flexion.  The   remaining  wound was closed with 2-0 Vicryl and running 4-0 Monocryl.   The knee was cleaned, dried, dressed sterilely using Dermabond and   Aquacel dressing.  Drain site dressed separately.  The patient was then   brought to  recovery room in stable condition, tolerating the procedure   well.   Please note that Physician Assistant, Lanney Gins, was present for the entirety of the case, and was utilized for pre-operative positioning, peri-operative retractor management, general facilitation of the procedure.  He was also utilized for primary wound closure at the end of the case.              Madlyn Frankel Charlann Boxer, M.D.

## 2012-09-23 NOTE — Interval H&P Note (Signed)
History and Physical Interval Note:  09/23/2012 1:46 PM  [name]  has presented today for surgery, with the diagnosis of left knee osteoarthritis   The various methods of treatment have been discussed with the patient and family. After consideration of risks, benefits and other options for treatment, the patient has consented to  Procedure(s): LEFT TOTAL KNEE ARTHROPLASTY (Left) as a surgical intervention .  The patient's history has been reviewed, patient examined, no change in status, stable for surgery.  I have reviewed the patient's chart and labs.  Questions were answered to the patient's satisfaction.     Evelyn Anderson

## 2012-09-23 NOTE — Anesthesia Postprocedure Evaluation (Signed)
Anesthesia Post Note  Patient: Evelyn Anderson  Procedure(s) Performed: Procedure(s) (LRB): LEFT TOTAL KNEE ARTHROPLASTY (Left)  Anesthesia type: General  Patient location: PACU  Post pain: Pain level controlled  Post assessment: Post-op Vital signs reviewed  Last Vitals: BP 173/80  Pulse 91  Temp(Src) 36.6 C (Axillary)  Resp 16  SpO2 100%  Post vital signs: Reviewed  Level of consciousness: sedated  Complications: No apparent anesthesia complications

## 2012-09-24 ENCOUNTER — Encounter (HOSPITAL_COMMUNITY): Payer: Self-pay | Admitting: Orthopedic Surgery

## 2012-09-24 LAB — BASIC METABOLIC PANEL
CO2: 28 mEq/L (ref 19–32)
Chloride: 100 mEq/L (ref 96–112)
Potassium: 3.5 mEq/L (ref 3.5–5.1)
Sodium: 135 mEq/L (ref 135–145)

## 2012-09-24 LAB — CBC
MCV: 81.4 fL (ref 78.0–100.0)
Platelets: 240 10*3/uL (ref 150–400)
RBC: 3.44 MIL/uL — ABNORMAL LOW (ref 3.87–5.11)
WBC: 8.8 10*3/uL (ref 4.0–10.5)

## 2012-09-24 LAB — GLUCOSE, CAPILLARY: Glucose-Capillary: 146 mg/dL — ABNORMAL HIGH (ref 70–99)

## 2012-09-24 NOTE — Progress Notes (Signed)
Physical Therapy Treatment Patient Details Name: Evelyn Anderson MRN: [id number] DOB: 1955-11-27 Today's Date: 09/24/2012 Time: 4098-1191 PT Time Calculation (min): 29 min  PT Assessment / Plan / Recommendation Comments on Treatment Session  pt progressing, still with significant pain; discussed with daughter pt progression/normal presentation for the first day    Follow Up Recommendations  SNF     Does the patient have the potential to tolerate intense rehabilitation     Barriers to Discharge        Equipment Recommendations  Rolling walker with 5" wheels    Recommendations for Other Services    Frequency 7X/week   Plan Discharge plan remains appropriate    Precautions / Restrictions Precautions Precautions: Knee;Fall Restrictions Weight Bearing Restrictions: No LLE Weight Bearing: Weight bearing as tolerated   Pertinent Vitals/Pain Pain not rated, c/o pain with mobility, pt was premedicated     Mobility  Bed Mobility Bed Mobility: Sit to Supine Sit to Supine: 4: Min assist;3: Mod assist;HOB flat Details for Bed Mobility Assistance: assist with LLE into bed and cues for technique Transfers Transfers: Sit to Stand;Stand to Sit;Stand Pivot Transfers Sit to Stand: 4: Min assist;3: Mod assist;From chair/3-in-1;With upper extremity assist Stand to Sit: 4: Min assist;3: Mod assist;To bed;With upper extremity assist Stand Pivot Transfers: 3: Mod assist Details for Transfer Assistance: multi-modal cues for hand placement, wt shift and assist to stabilize Ambulation/Gait Ambulation/Gait Assistance: 4: Min assist;3: Mod assist Ambulation Distance (Feet): 4 Feet Assistive device: Rolling walker Ambulation/Gait Assistance Details: multi-modal cues for sequnce, use of UEs, RW position and posture Gait Pattern: Step-to pattern;Trunk flexed Gait velocity: slow    Exercises Total Joint Exercises Ankle Circles/Pumps: AROM;Both;10 reps Heel Slides: AAROM;Left;10 reps Straight Leg  Raises: AAROM;Left;10 reps Goniometric ROM: approx 5-38*   PT Diagnosis:    PT Problem List:   PT Treatment Interventions:     PT Goals Acute Rehab PT Goals Time For Goal Achievement: 10/01/12 Potential to Achieve Goals: Good Pt will go Sit to Stand: with supervision PT Goal: Sit to Stand - Progress: Progressing toward goal Pt will go Stand to Sit: with supervision PT Goal: Stand to Sit - Progress: Progressing toward goal Pt will Ambulate: 51 - 150 feet;with supervision PT Goal: Ambulate - Progress: Progressing toward goal Pt will Perform Home Exercise Program: with supervision, verbal cues required/provided PT Goal: Perform Home Exercise Program - Progress: Progressing toward goal  Visit Information  Last PT Received On: 09/24/12 Assistance Needed: +1    Subjective Data  Subjective: pt with c/o pain left knee, dtr states she was premedicated   Cognition  Cognition Arousal/Alertness: Awake/alert Behavior During Therapy: WFL for tasks assessed/performed Overall Cognitive Status: Within Functional Limits for tasks assessed    Balance     End of Session PT - End of Session Equipment Utilized During Treatment: Gait belt Activity Tolerance: Patient limited by fatigue;Patient limited by pain Patient left: in bed;with call bell/phone within reach;with family/visitor present Nurse Communication: Mobility status   GP     [facility] 09/24/2012, 3:41 PM

## 2012-09-24 NOTE — Progress Notes (Signed)
Inpatient Diabetes Program Recommendations  AACE/ADA: New Consensus Statement on Inpatient Glycemic Control (2013)  Target Ranges:  Prepandial:   less than 140 mg/dL      Peak postprandial:   less than 180 mg/dL (1-2 hours)      Critically ill patients:  140 - 180 mg/dL   Reason for Visit: Results for CLEMA, SKOUSEN (MRN [id number]) as of 09/24/2012 09:23  Ref. Range 09/23/2012 11:03 09/23/2012 16:25 09/23/2012 18:36 09/23/2012 21:42 09/24/2012 07:42  Glucose-Capillary Latest Range: 70-99 mg/dL 102 (H) 725 (H) 366 (H) 188 (H) 146 (H)   Note history of diabetes.  Please check A1C to determine pre-hospitalization glycemic control. No further recommendations at this time.

## 2012-09-24 NOTE — Evaluation (Signed)
Occupational Therapy Evaluation Patient Details Name: Evelyn Anderson MRN: [id number] DOB: Sep 04, 1955 Today's Date: 09/24/2012 Time: 4098-1191 OT Time Calculation (min): 25 min  OT Assessment / Plan / Recommendation Clinical Impression  Pt is a 57 yo female who does not speak English but whose daughter was available to translate who underwent a L TKA. Pt with the deficits listed below. Pt would benfit from cont OT to address these deficits as she was fairly debilitated before surgery.  Pt did not leave house except for MD appts.  Pt's daugthter works and therefore I feel this pt would greatly benefit from short SNF stay before returning home.  Pt is a fall risk if she is to d/c home w.o rehab.    OT Assessment  Patient needs continued OT Services    Follow Up Recommendations  SNF;Supervision/Assistance - 24 hour    Barriers to Discharge Decreased caregiver support daughter has to return to work next week. pt would be alone which is not safe at this point.  Equipment Recommendations  3 in 1 bedside comode    Recommendations for Other Services    Frequency  Min 3X/week    Precautions / Restrictions Precautions Precautions: Knee;Fall Restrictions Weight Bearing Restrictions: No   Pertinent Vitals/Pain Pt with 5/10 pain.    ADL  Eating/Feeding: Simulated;Set up Where Assessed - Eating/Feeding: Chair Grooming: Simulated;Set up Where Assessed - Grooming: Supported sitting Upper Body Bathing: Simulated;Set up Where Assessed - Upper Body Bathing: Supported sitting Lower Body Bathing: Simulated;Moderate assistance Where Assessed - Lower Body Bathing: Supported sit to stand Upper Body Dressing: Simulated;Set up Where Assessed - Upper Body Dressing: Supported sitting Lower Body Dressing: Simulated;Maximal assistance Where Assessed - Lower Body Dressing: Supported sit to stand Toilet Transfer: Performed;Moderate assistance Toilet Transfer Method: Stand pivot Toilet Transfer Equipment:  Bedside commode Toileting - Clothing Manipulation and Hygiene: Simulated;Moderate assistance Where Assessed - Toileting Clothing Manipulation and Hygiene: Standing Equipment Used: Gait belt;Rolling walker Transfers/Ambulation Related to ADLs: Pt walked to door with assist of 1 and requested to sit due to pain. ADL Comments: Pt with more assist needed for LE adls.  pt with difficulty reaching L LE for sock/shoe/pants.    OT Diagnosis: Acute pain;Generalized weakness  OT Problem List: Decreased strength;Impaired balance (sitting and/or standing);Decreased knowledge of use of DME or AE;Decreased knowledge of precautions;Pain OT Treatment Interventions: Self-care/ADL training;DME and/or AE instruction;Therapeutic activities   OT Goals Acute Rehab OT Goals OT Goal Formulation: With patient/family Time For Goal Achievement: 10/01/12 Potential to Achieve Goals: Good ADL Goals Pt Will Perform Grooming: with supervision;Standing at sink ADL Goal: Grooming - Progress: Goal set today Pt Will Perform Lower Body Bathing: with supervision;Sit to stand from chair;Standing at sink;Sitting at sink ADL Goal: Lower Body Bathing - Progress: Goal set today Pt Will Perform Lower Body Dressing: with supervision;Sit to stand from chair;with adaptive equipment ADL Goal: Lower Body Dressing - Progress: Goal set today Pt Will Perform Tub/Shower Transfer: Tub transfer;with min assist ADL Goal: Tub/Shower Transfer - Progress: Goal set today Additional ADL Goal #1: Pt will toilet on standard toilet with 3:1 over top with S. ADL Goal: Additional Goal #1 - Progress: Goal set today  Visit Information  Last OT Received On: 09/24/12 Assistance Needed: +1 PT/OT Co-Evaluation/Treatment: Yes    Subjective Data  Subjective: Pt reported mild dizziness. Patient Stated Goal: to go to rehab then home.   Prior Functioning     Home Living Lives With: Family Available Help at Discharge: Available  PRN/intermittently Type of  Home: House Home Access: Stairs to enter [employer] of Steps: 3 Entrance Stairs-Rails: None Home Layout: One level Bathroom Shower/Tub: Tub only;Curtain Bathroom Toilet: Standard Home Adaptive Equipment: None Additional Comments: Pt has been fairly home bound for last few months due to immobility and pain. Prior Function Level of Independence: Independent (but limited to home.) Able to Take Stairs?: Yes (only the three steps to get into house rarely) Driving: No Vocation: Retired Comments: Pt basically stays inside most of time.  Cooks for herself. Communication Communication: Prefers language other than English;Other (comment) (Speaks Punjabi) Dominant Hand: Right         Vision/Perception Vision - History Baseline Vision: No visual deficits Patient Visual Report: No change from baseline Vision - Assessment Eye Alignment: Within Functional Limits Vision Assessment: Vision not tested   Cognition  Cognition Arousal/Alertness: Awake/alert Behavior During Therapy: WFL for tasks assessed/performed Overall Cognitive Status: Within Functional Limits for tasks assessed    Extremity/Trunk Assessment Right Upper Extremity Assessment RUE ROM/Strength/Tone: Within functional levels RUE Sensation: WFL - Light Touch RUE Coordination: WFL - gross/fine motor Left Upper Extremity Assessment LUE ROM/Strength/Tone: Within functional levels LUE Sensation: WFL - Light Touch LUE Coordination: WFL - gross/fine motor Trunk Assessment Trunk Assessment: Normal     Mobility Bed Mobility Bed Mobility: Supine to Sit Supine to Sit: 3: Mod assist Details for Bed Mobility Assistance: Pt needed assist scooting hips to side of bed and getting L leg off of the bed. Transfers Transfers: Sit to Stand;Stand to Sit Sit to Stand: 3: Mod assist;From bed;With upper extremity assist Stand to Sit: 3: Mod assist;To chair/3-in-1;With upper extremity assist Details  for Transfer Assistance: Pt w pain when standing.  Cues given to  daughter to give to pt about order of moving legs with walker.     Exercise     Balance Balance Balance Assessed: No   End of Session OT - End of Session Equipment Utilized During Treatment: Gait belt Activity Tolerance: Patient tolerated treatment well Patient left: in chair;with call bell/phone within reach;with family/visitor present Nurse Communication: Mobility status;Other (comment) (need for rehab)  GO     Hope Budds 09/24/2012, 9:51 AM [phone]

## 2012-09-24 NOTE — Evaluation (Signed)
Physical Therapy Evaluation Patient Details Name: Evelyn [facility] MRN: [id number] DOB: 12-21-1955 Today's Date: 09/24/2012 Time: 4098-1191 PT Time Calculation (min): 24 min  PT Assessment / Plan / Recommendation Clinical Impression  pt s/p left TKA and will benefit from PT  to improve independence and safety; Pt will likely need SNF prior to return home    PT Assessment  Patient needs continued PT services    Follow Up Recommendations  SNF    Does the patient have the potential to tolerate intense rehabilitation      Barriers to Discharge        Equipment Recommendations  Rolling walker with 5" wheels    Recommendations for Other Services     Frequency 7X/week    Precautions / Restrictions Precautions Precautions: Knee;Fall Restrictions Weight Bearing Restrictions: No   Pertinent Vitals/Pain 10/10 pain left knee was premedicated      Mobility  Bed Mobility Bed Mobility: Supine to Sit Supine to Sit: 3: Mod assist Details for Bed Mobility Assistance: Pt needed assist scooting hips to side of bed and getting L leg off of the bed. Transfers Sit to Stand: 3: Mod assist;From bed;With upper extremity assist Stand to Sit: 3: Mod assist;To chair/3-in-1;With upper extremity assist Details for Transfer Assistance: Pt w pain when standing.  Cues given to  daughter to give to pt about order of moving legs with walker. Ambulation/Gait Ambulation/Gait Assistance: 3: Mod assist Ambulation Distance (Feet): 14 Feet Assistive device: Rolling walker Ambulation/Gait Assistance Details: multi-modal cues for sequnce, use of UEs, RW position and posture Gait Pattern: Step-to pattern;Trunk flexed Gait velocity: slow    Exercises Total Joint Exercises Ankle Circles/Pumps: AROM;AAROM;Both;10 reps Quad Sets: AROM;Both;5 reps   PT Diagnosis: Difficulty walking;Acute pain  PT Problem List: Decreased strength;Decreased range of motion;Decreased activity tolerance;Decreased  mobility;Decreased safety awareness;Decreased knowledge of precautions;Decreased knowledge of use of DME PT Treatment Interventions: DME instruction;Gait training;Functional mobility training;Therapeutic activities;Stair training;Therapeutic exercise;Patient/family education   PT Goals Acute Rehab PT Goals PT Goal Formulation: With patient/family Time For Goal Achievement: 10/01/12 Potential to Achieve Goals: Good Pt will go Supine/Side to Sit: with min assist PT Goal: Supine/Side to Sit - Progress: Goal set today Pt will go Sit to Stand: with supervision PT Goal: Sit to Stand - Progress: Goal set today Pt will go Stand to Sit: with supervision PT Goal: Stand to Sit - Progress: Goal set today Pt will Ambulate: 51 - 150 feet;with supervision PT Goal: Ambulate - Progress: Goal set today Pt will Go Up / Down Stairs: 3-5 stairs;with min assist;with least restrictive assistive device PT Goal: Up/Down Stairs - Progress: Goal set today Pt will Perform Home Exercise Program: with supervision, verbal cues required/provided PT Goal: Perform Home Exercise Program - Progress: Goal set today  Visit Information  Last PT Received On: 09/24/12 Assistance Needed: +1 PT/OT Co-Evaluation/Treatment: Yes    Subjective Data  Subjective: pt nods, daughter present and offering translation Patient Stated Goal: none; daughter states pt "does not do much"   Prior Functioning  Home Living Lives With: Family Available Help at Discharge: Available PRN/intermittently Type of Home: House Home Access: Stairs to enter Secretary/administrator of Steps: 3 Entrance Stairs-Rails: None Home Layout: One level Bathroom Shower/Tub: Tub only;Curtain Bathroom Toilet: Standard Home Adaptive Equipment: None Additional Comments: Pt has been fairly home bound for last few months due to immobility and pain. household ambulator per daughter, only comes out to go to MD;  Prior Function Level of Independence: Independent  (but limited to home.)  Able to Take Stairs?: Yes (only the three steps to get into house rarely) Driving: No Vocation: Retired Comments: Pt basically stays inside most of time.  Cooks for herself. Communication Communication: Prefers language other than English;Other (comment) (Speaks Punjabi) Dominant Hand: Right    Cognition  Cognition Arousal/Alertness: Awake/alert Behavior During Therapy: WFL for tasks assessed/performed Overall Cognitive Status: Within Functional Limits for tasks assessed    Extremity/Trunk Assessment Right Upper Extremity Assessment RUE ROM/Strength/Tone: Within functional levels RUE Sensation: WFL - Light Touch RUE Coordination: WFL - gross/fine motor Left Upper Extremity Assessment LUE ROM/Strength/Tone: Within functional levels LUE Sensation: WFL - Light Touch LUE Coordination: WFL - gross/fine motor Right Lower Extremity Assessment RLE ROM/Strength/Tone: WFL for tasks assessed Left Lower Extremity Assessment LLE ROM/Strength/Tone: Deficits;Unable to fully assess;Due to pain LLE ROM/Strength/Tone Deficits: ankle WFL; able to assist minimally with SLR with KI on, very painful with mobility Trunk Assessment Trunk Assessment: Normal   Balance Balance Balance Assessed: No  End of Session PT - End of Session Equipment Utilized During Treatment: Gait belt;Left knee immobilizer Activity Tolerance: Patient tolerated treatment well;Patient limited by fatigue;Patient limited by pain Patient left: in chair;with call bell/phone within reach;with family/visitor present Nurse Communication: Mobility status  GP     [facility] 09/24/2012, 10:25 AM

## 2012-09-24 NOTE — Progress Notes (Signed)
Clinical Social Work Department CLINICAL SOCIAL WORK PLACEMENT NOTE 09/24/2012  Patient:  Evelyn Anderson, Evelyn Anderson  Account Number:  [id number] Admit date:  09/23/2012  Clinical Social Worker:  Cori Razor, LCSW  Date/time:  09/24/2012 02:38 PM  Clinical Social Work is seeking post-discharge placement for this patient at the following level of care:   SKILLED NURSING   (*CSW will update this form in Epic as items are completed)   09/24/2012  Patient/family provided with [facility] Department of Clinical Social Work's list of facilities offering this level of care within the geographic area requested by the patient (or if unable, by the patient's family).    Patient/family informed of their freedom to choose among providers that offer the needed level of care, that participate in Medicare, Medicaid or managed care program needed by the patient, have an available bed and are willing to accept the patient.  09/24/2012  Patient/family informed of [facility]' ownership interest in [facility], as well as of the fact that they are under no obligation to receive care at this facility.  PASARR submitted to EDS on 09/24/2012 PASARR number received from [employer] on 09/24/2012  FL2 transmitted to all facilities in geographic area requested by pt/family on   FL2 transmitted to all facilities within larger geographic area on   Patient informed that his/her managed care company has contracts with or will negotiate with  certain facilities, including the following:     Patient/family informed of bed offers received:  09/24/2012 Patient chooses bed at  Physician recommends and patient chooses bed at    Patient to be transferred to  on   Patient to be transferred to facility by   The following physician request were entered in Epic:   Additional Comments:  Cori Razor LCSW [phone]

## 2012-09-24 NOTE — Progress Notes (Signed)
Clinical Social Work Department BRIEF PSYCHOSOCIAL ASSESSMENT 09/24/2012  Patient:  Evelyn Anderson, Evelyn Anderson     Account Number:  [id number]     Admit date:  09/23/2012  Clinical Social Worker:  Candie Chroman  Date/Time:  09/24/2012 01:33 PM  Referred by:  Physician  Date Referred:  09/24/2012 Referred for  SNF Placement   Other Referral:   Interview type:  Family Other interview type:    PSYCHOSOCIAL DATA Living Status:  FAMILY Admitted from facility:   Level of care:   Primary support name:  Harpeet Holness Primary support relationship to patient:  CHILD, ADULT Degree of support available:   supportive    CURRENT CONCERNS Current Concerns  Post-Acute Placement   Other Concerns:    SOCIAL WORK ASSESSMENT / PLAN Pt is a 57 yr old female living with her daughter prior to hospitalization. CSW met with pt / daughter to assist with d/c planning. Pt is non english speaking. PT has recommended ST SNF placement following hospital d/c. Pt/daughter are in agreement with plan for SNF placement providing it is not too far from [place]. SNF search initiated and one bed offer received from [facility] CSW will continue  to follow to assist with d/c planning to SNF.   Assessment/plan status:  Psychosocial Support/Ongoing Assessment of Needs Other assessment/ plan:   Information/referral to community resources:   SNF list with bed offers to be provided.    PATIENT'S/FAMILY'S RESPONSE TO PLAN OF CARE: Family would like pt as close to home as possible for her rehab.   Cori Razor LCSW [phone]

## 2012-09-25 DIAGNOSIS — D500 Iron deficiency anemia secondary to blood loss (chronic): Secondary | ICD-10-CM | POA: Diagnosis not present

## 2012-09-25 DIAGNOSIS — E669 Obesity, unspecified: Secondary | ICD-10-CM | POA: Diagnosis present

## 2012-09-25 LAB — BASIC METABOLIC PANEL
BUN: 12 mg/dL (ref 6–23)
CO2: 28 mEq/L (ref 19–32)
Calcium: 9.1 mg/dL (ref 8.4–10.5)
Chloride: 98 mEq/L (ref 96–112)
Creatinine, Ser: 1.01 mg/dL (ref 0.50–1.10)

## 2012-09-25 LAB — CBC
HCT: 29.6 % — ABNORMAL LOW (ref 36.0–46.0)
MCH: 26.9 pg (ref 26.0–34.0)
MCV: 82.2 fL (ref 78.0–100.0)
RBC: 3.6 MIL/uL — ABNORMAL LOW (ref 3.87–5.11)
WBC: 8 10*3/uL (ref 4.0–10.5)

## 2012-09-25 LAB — GLUCOSE, CAPILLARY
Glucose-Capillary: 162 mg/dL — ABNORMAL HIGH (ref 70–99)
Glucose-Capillary: 202 mg/dL — ABNORMAL HIGH (ref 70–99)
Glucose-Capillary: 123 mg/dL — ABNORMAL HIGH (ref 70–99)

## 2012-09-25 MED ORDER — DSS 100 MG PO CAPS: 100.0000 mg | ORAL_CAPSULE | Freq: Two times a day (BID) | ORAL | Status: DC

## 2012-09-25 MED ORDER — HYDROCODONE-ACETAMINOPHEN 7.5-325 MG PO TABS: 1.0000 | ORAL_TABLET | ORAL | Status: AC

## 2012-09-25 MED ORDER — KETOROLAC TROMETHAMINE 15 MG/ML IJ SOLN
15.0000 mg | Freq: Four times a day (QID) | INTRAMUSCULAR | Status: AC
  Administered 2012-09-25 – 2012-09-26 (×4): 15 mg via INTRAVENOUS
  Filled 2012-09-25 (×4): qty 1

## 2012-09-25 MED ORDER — KETOROLAC TROMETHAMINE 15 MG/ML IJ SOLN: 7.5000 mg | Freq: Four times a day (QID) | INTRAMUSCULAR | Status: DC

## 2012-09-25 MED ORDER — TIZANIDINE HCL 4 MG PO CAPS: 4.0000 mg | ORAL_CAPSULE | Freq: Three times a day (TID) | ORAL | Status: DC

## 2012-09-25 MED ORDER — FERROUS SULFATE 325 (65 FE) MG PO TABS: 325.0000 mg | ORAL_TABLET | Freq: Three times a day (TID) | ORAL | Status: DC

## 2012-09-25 MED ORDER — ASPIRIN 325 MG PO TBEC: 325.0000 mg | DELAYED_RELEASE_TABLET | Freq: Two times a day (BID) | ORAL | Status: AC

## 2012-09-25 MED ORDER — RISAQUAD PO CAPS
1.0000 | ORAL_CAPSULE | Freq: Three times a day (TID) | ORAL | Status: DC
  Administered 2012-09-25 – 2012-09-26 (×4): 1 via ORAL
  Filled 2012-09-25 (×6): qty 1

## 2012-09-25 MED ORDER — POLYETHYLENE GLYCOL 3350 17 G PO PACK: 17.0000 g | PACK | Freq: Two times a day (BID) | ORAL | Status: DC

## 2012-09-25 NOTE — Progress Notes (Signed)
   Subjective: 2 Days Post-Op Procedure(s) (LRB): LEFT TOTAL KNEE ARTHROPLASTY (Left)   Patient reports pain as mild, increasing with movement of the knee. No events throughout the night.  Objective:   VITALS:   Filed Vitals:   09/25/12 0619  BP: 156/99  Pulse: 91  Temp: 98.4 F (36.9 C)  Resp: 16    Neurovascular intact Dorsiflexion/Plantar flexion intact Incision: dressing C/D/I No cellulitis present Compartment soft  LABS  Recent Labs  09/24/12 0413 09/25/12 0438  HGB 9.1* 9.7*  HCT 28.0* 29.6*  WBC 8.8 8.0  PLT 240 214     Recent Labs  09/24/12 0413 09/25/12 0438  NA 135 133*  K 3.5 3.6  BUN 15 12  CREATININE 0.97 1.01  GLUCOSE 173* 171*     Assessment/Plan: 2 Days Post-Op Procedure(s) (LRB): LEFT TOTAL KNEE ARTHROPLASTY (Left) Up with therapy Discharge to SNF eventually, when ready  Expected ABLA  Treated with iron and will observe  Obese (BMI 30-39.9) Estimated body mass index is 33.83 kg/(m^2) as calculated from the following:   Height as of this encounter: 5\' 2"  (1.575 m).   Weight as of this encounter: 83.915 kg (185 lb). Patient also counseled that weight may inhibit the healing process Patient counseled that losing weight will help with future health issues      Anastasio Auerbach. Emerie Vanderkolk   PAC  09/25/2012, 1:51 PM

## 2012-09-25 NOTE — Discharge Summary (Signed)
Physician Discharge Summary  Patient ID: Evelyn Anderson MRN: [id number] DOB/AGE: 58-Jun-1957 59 y.o.  Admit date: 09/23/2012 Discharge date:  09/26/2012  Procedures:  Procedure(s) (LRB): LEFT TOTAL KNEE ARTHROPLASTY (Left)  Attending Physician:  Dr. Durene Anderson   Admission Diagnoses:   Left knee OA / pain  Discharge Diagnoses:  Principal Problem:   S/P left TKA Active Problems:   Expected blood loss anemia   Obese  Past Medical History  Diagnosis Date  . Hypertension   . Hypothyroidism   . Diabetes mellitus without complication   . Seasonal allergies   . GERD (gastroesophageal reflux disease)   . Arthritis   . Neuromuscular disorder     peripheral neuropathy    HPI:    Evelyn Anderson, 57 y.o. female, has a history of pain and functional disability in the left knee due to arthritis and has failed non-surgical conservative treatments for greater than 12 weeks to includeNSAID's and/or analgesics, corticosteriod injections, use of assistive devices and activity modification. Onset of symptoms was gradual, starting 2 years ago with gradually worsening course since that time. The patient noted no past surgery on the left knee(s). Patient currently rates pain in the left knee(s) at 8 out of 10 with activity. Patient has worsening of pain with activity and weight bearing, pain that interferes with activities of daily living, pain with passive range of motion, crepitus and joint swelling. Patient has evidence of periarticular osteophytes and joint space narrowing by imaging studies. There is no active infection. Risks, benefits and expectations were discussed with the patient. Patient understand the risks, benefits and expectations and wishes to proceed with surgery.  PCP: Evelyn Pickler, MD   Discharged Condition: good  Anderson Course:  Patient underwent the above stated procedure on 09/23/2012. Patient tolerated the procedure well and brought to the recovery room in good  condition and subsequently to the floor.  POD #1 BP: 161/90 ; Pulse: 79 ; Temp: 98.7 F (37.1 C) ; Resp: 16  Pt's foley was removed, as well as the hemovac drain removed. IV was changed to a saline lock. Patient reports pain as mild. No events throughout the night. Neurovascular intact, dorsiflexion/plantar flexion intact, incision: dressing C/Anderson/I, no cellulitis present and compartment soft.   LABS  Basename  09/24/12    0413   HGB  9.1  HCT  28.0   POD #2  BP: 156/99 ; Pulse: 91 ; Temp: 98.4 F (36.9 C) ; Resp: 16  Patient reports pain as mild, increasing with movement of the knee. No events throughout the night. Neurovascular intact, dorsiflexion/plantar flexion intact, incision: dressing C/Anderson/I, no cellulitis present and compartment soft.   LABS  Basename  09/25/12    0438   HGB  9.7  HCT  29.6   POD #3  Vital signs stable Patient reports pain as mild, increasing with movement of the knee. No events throughout the night. Neurovascular intact, dorsiflexion/plantar flexion intact, incision: dressing C/Anderson/I, no cellulitis present and compartment soft.   LABS   No new labs   Discharge Exam: General appearance: alert, cooperative and no distress Extremities: Homans sign is negative, no sign of DVT, no edema, redness or tenderness in the calves or thighs and no ulcers, gangrene or trophic changes  Disposition: SNF  with follow up in 2 weeks   Follow up in 2 weeks at [facility]. Follow up with Evelyn Anderson,Evelyn Anderson in 2 weeks.  Contact information:  [facility] [address], [address] [place]  [address] [phone]      Discharge Orders   Future Orders Complete By Expires     Call MD / Call 911  As directed     Comments:      If you experience chest pain or shortness of breath, CALL 911 and be transported to the [facility] emergency room.  If you develope a fever above 101 F, pus (white drainage) or increased drainage or redness  at the wound, or calf pain, call your surgeon's office.    Change dressing  As directed     Comments:      Maintain surgical dressing for 10-14 days, then change the dressing daily with sterile 4 x 4 inch gauze dressing and tape. Keep the area dry and clean.    Constipation Prevention  As directed     Comments:      Drink plenty of fluids.  Prune juice may be helpful.  You may use a stool softener, such as Colace (over the counter) 100 mg twice a day.  Use MiraLax (over the counter) for constipation as needed.    Diet - low sodium heart healthy  As directed     Discharge instructions  As directed     Comments:      Maintain surgical dressing for 10-14 days, then replace with gauze and tape. Keep the area dry and clean until follow up. Follow up in 2 weeks at [facility]. Call with any questions or concerns.    Increase activity slowly as tolerated  As directed     TED hose  As directed     Comments:      Use stockings (TED hose) for 2 weeks on both leg(s).  You may remove them at night for sleeping.    Weight bearing as tolerated  As directed          Medication List    STOP taking these medications       NON FORMULARY      TAKE these medications       aspirin 325 MG EC tablet  Take 1 tablet (325 mg total) by mouth 2 (two) times daily.     Calcium Anderson-Glucarate 500 MG Caps  Take 2 capsules by mouth 3 (three) times daily.     cetirizine 10 MG tablet  Commonly known as:  ZYRTEC  Take 10 mg by mouth every evening.     CHIA SEED PO  Take 15 mLs by mouth daily.     cloNIDine 0.1 MG tablet  Commonly known as:  CATAPRES  Take 0.1 mg by mouth every evening.     dicyclomine 10 MG capsule  Commonly known as:  BENTYL  Take 10 mg by mouth 2 (two) times daily.     DSS 100 MG Caps  Take 100 mg by mouth 2 (two) times daily.     ferrous sulfate 325 (65 FE) MG tablet  Take 1 tablet (325 mg total) by mouth 3 (three) times daily after meals.     fish oil-omega-3 fatty  acids 1000 MG capsule  Take 1 g by mouth daily.     glipiZIDE 10 MG tablet  Commonly known as:  GLUCOTROL  Take 10 mg by mouth 2 (two) times daily before a meal.     glucosamine-chondroitin 500-400 MG tablet  Take 1 tablet by mouth daily as needed (joint pain).     HYDROcodone-acetaminophen 7.5-325 MG per tablet  Commonly known as:  NORCO  Take 1-2 tablets by mouth  every 4 (four) hours.     hydrOXYzine 10 MG tablet  Commonly known as:  ATARAX/VISTARIL  Take 10 mg by mouth daily as needed for itching.     insulin glargine 100 UNIT/ML injection  Commonly known as:  LANTUS  Inject 10 Units into the skin at bedtime.     L-GLUTAMINE PO  Take 1 tablet by mouth daily.     lactobacillus acidophilus Tabs  Take 1 tablet by mouth 3 (three) times daily.     levothyroxine 100 MCG tablet  Commonly known as:  SYNTHROID, LEVOTHROID  Take 100 mcg by mouth daily before breakfast.     losartan-hydrochlorothiazide 50-12.5 MG per tablet  Commonly known as:  HYZAAR  Take 1 tablet by mouth daily before breakfast.     polyethylene glycol packet  Commonly known as:  MIRALAX / GLYCOLAX  Take 17 g by mouth 2 (two) times daily.     ranitidine 300 MG tablet  Commonly known as:  ZANTAC  Take 300 mg by mouth daily before breakfast.     simvastatin 40 MG tablet  Commonly known as:  ZOCOR  Take 40 mg by mouth every evening.     sitaGLIPtin 100 MG tablet  Commonly known as:  JANUVIA  Take 50 mg by mouth 2 (two) times daily.     tiZANidine 4 MG capsule  Commonly known as:  ZANAFLEX  Take 1 capsule (4 mg total) by mouth 3 (three) times daily. Muscle spasms     Vitamin Anderson-3 5000 UNITS Tabs  Take 1 tablet by mouth daily.         Signed: Anastasio Auerbach. Morton Simson   PAC  09/25/2012, 2:18 PM

## 2012-09-25 NOTE — Progress Notes (Signed)
CSW assisting with d/c planning. Medicaid prior authorization has been completed. ST SNF bed available at [facility] if pt is stable for D/C. D/C Summary has been faxed to [facility]. Weekend CSW will assist with d/c planning to SNF.  Cori Razor LCSW [phone]

## 2012-09-25 NOTE — Progress Notes (Signed)
   Subjective: 1 Day Post-Op Procedure(s) (LRB): LEFT TOTAL KNEE ARTHROPLASTY (Left)   Daughter acting as translator Patient reports pain as mild.  No events throughout the night.  Objective:   VITALS:   Filed Vitals:   09/24/12 0633  BP: 161/90  Pulse: 79  Temp: 98.7 F (37.1 C)  Resp: 16    Neurovascular intact Dorsiflexion/Plantar flexion intact Incision: dressing C/D/I No cellulitis present Compartment soft  LABS  Recent Labs  09/24/12 0413  HGB 9.1*  HCT 28.0*  WBC 8.8  PLT 240     Recent Labs  09/24/12 0413  NA 135  K 3.5  BUN 15  CREATININE 0.97  GLUCOSE 173*     Assessment/Plan: 1 Day Post-Op Procedure(s) (LRB): LEFT TOTAL KNEE ARTHROPLASTY (Left) HV drain d/c'ed Foley cath d/c'ed Advance diet Up with therapy D/C IV fluids Discharge to SNF eventually, when ready  Expected ABLA  Treated with iron and will observe  Obese (BMI 30-39.9)  Estimated body mass index is 33.83 kg/(m^2) as calculated from the following:   Height as of this encounter: 5\' 2"  (1.575 m).   Weight as of this encounter: 83.915 kg (185 lb). Patient also counseled that weight may inhibit the healing process Patient counseled that losing weight will help with future health issues       Anastasio Auerbach. Drayven Marchena   PAC  09/25/2012, 1:53 PM

## 2012-09-25 NOTE — Care Management Note (Signed)
    Page 1 of 1   09/25/2012     3:21:40 PM   CARE MANAGEMENT NOTE 09/25/2012  Patient:  Evelyn Anderson, Evelyn Anderson   Account Number:  [id number]  Date Initiated:  09/24/2012  Documentation initiated by:  [facility]  Subjective/Objective Assessment:   57 year old female admitted s/p left  knee replacement.     Action/Plan:   SNF vs HH services at d/c.   Anticipated DC Date:  09/26/2012   Anticipated DC Plan:  SKILLED NURSING FACILITY  In-house referral  Clinical Social Worker      DC Planning Services  CM consult      Choice offered to / List presented to:             Status of service:   Medicare Important Message given?  NA - LOS <3 / Initial given by admissions (If response is "NO", the following Medicare IM given date fields will be blank) Date Medicare IM given:   Date Additional Medicare IM given:    Discharge Disposition:    Per UR Regulation:  Reviewed for med. necessity/level of care/duration of stay  If discussed at Long Length of Stay Meetings, dates discussed:    Comments:  09/25/2012 Colleen Can BSN RN CCM [phone] Final plans are for patient to go to SNF for REhab.

## 2012-09-25 NOTE — Progress Notes (Addendum)
Occupational Therapy Treatment Patient Details Name: Evelyn Anderson MRN: [id number] DOB: 04-Sep-1955 Today's Date: 09/25/2012 Time: 1207-1225 OT Time Calculation (min): 18 min  OT Assessment / Plan / Recommendation Comments on Treatment Session Pt still has significant pain     Follow Up Recommendations  SNF;Supervision/Assistance - 24 hour    Barriers to Discharge       Equipment Recommendations  3 in 1 bedside comode    Recommendations for Other Services    Frequency Min 3X/week   Plan Discharge plan remains appropriate    Precautions / Restrictions Precautions Precautions: Knee;Fall Restrictions Weight Bearing Restrictions: No LLE Weight Bearing: Weight bearing as tolerated   Pertinent Vitals/Pain 8/10 L LE; repositioned.  Pt declined ice.  RN to check if more pain meds could be given (premedicated before OT)    ADL  Grooming: Wash/dry hands;Supervision/safety Where Assessed - Grooming: Supported sitting Toilet Transfer: Minimal Dentist Method: Sit to Barista: Raised toilet seat with arms (or 3-in-1 over toilet) Toileting - Clothing Manipulation and Hygiene: Min guard Where Assessed - Toileting Clothing Manipulation and Hygiene: Standing Transfers/Ambulation Related to ADLs: pt ambulated to bathroom with vcs for taking weight through arms and to sidestep to commode through tight space.  Pt with significant pain.  L calf was sticking to legrest of recliner:  used wet paper towel to loosen ADL Comments: bathind and dressing were done this am.  Daughter present and interpreted    OT Diagnosis:    OT Problem List:   OT Treatment Interventions:     OT Goals ADL Goals Pt Will Perform Grooming: with supervision;Standing at sink ADL Goal: Grooming - Progress: Met Pt Will Perform Lower Body Bathing: with supervision;Sit to stand from chair;Standing at sink;Sitting at sink Pt Will Perform Lower Body Dressing: with supervision;Sit to  stand from chair;with adaptive equipment Pt Will Perform Tub/Shower Transfer: Tub transfer;with min assist Additional ADL Goal #1: Pt will toilet on standard toilet with 3:1 over top with S. ADL Goal: Additional Goal #1 - Progress: Progressing toward goals  Visit Information  Last OT Received On: 09/25/12 Assistance Needed: +1    Subjective Data      Prior Functioning       Cognition  Cognition Arousal/Alertness: Awake/alert Behavior During Therapy: WFL for tasks assessed/performed Overall Cognitive Status: Within Functional Limits for tasks assessed    Mobility  Bed Mobility Bed Mobility: Sit to supine Supine to Sit: 4: Min assist Details for Bed Mobility Assistance: Min assist to support L LE and increased time Transfers Sit to Stand: 4: Min guard;From chair/3-in-1;From toilet Stand to Sit: 4: Min guard;4: Min assist;To toilet;To chair/3-in-1 Details for Transfer Assistance: vcs for hand/leg placement    Exercises      Balance     End of Session OT - End of Session Activity Tolerance: Patient limited by pain Patient left: in bed;with call bell/phone within reach;with family/visitor present  GO     Evelyn Anderson 09/25/2012, 1:14 PM Marica Otter, OTR/L [phone] 09/25/2012

## 2012-09-25 NOTE — Progress Notes (Signed)
Physical Therapy Treatment Patient Details Name: Evelyn Anderson MRN: [id number] DOB: 18-Dec-1955 Today's Date: 09/25/2012 Time: 8657-8469 PT Time Calculation (min): 25 min  PT Assessment / Plan / Recommendation Comments on Treatment Session  POD # 2 L TKR pm session.  Assisted pt OOB to amb to BR to void then amb back to bed.  Pt required increased time and demon difficulty WBing thru L LE, pain meds requested.    Follow Up Recommendations  SNF     Does the patient have the potential to tolerate intense rehabilitation     Barriers to Discharge        Equipment Recommendations  Rolling walker with 5" wheels    Recommendations for Other Services    Frequency 7X/week   Plan Discharge plan remains appropriate    Precautions / Restrictions Precautions Precautions: Knee;Fall Restrictions Weight Bearing Restrictions: No LLE Weight Bearing: Weight bearing as tolerated   Pertinent Vitals/Pain C/o MAX pain with act Pain meds requested    Mobility  Bed Mobility Bed Mobility: Sit to Supine Supine to Sit: 4: Min assist Details for Bed Mobility Assistance: Min assist to support L LE and increased time Transfers Transfers: Sit to Stand;Stand to Sit Sit to Stand: 4: Min guard;From chair/3-in-1;From toilet Stand to Sit: 4: Min guard;4: Min assist;To toilet;To chair/3-in-1 Details for Transfer Assistance: vcs for hand/leg placement Ambulation/Gait Ambulation/Gait Assistance: 4: Min assist Ambulation Distance (Feet): 16 Feet (8 feet to BR then another 8 back to bed) Assistive device: Rolling walker Ambulation/Gait Assistance Details: 25% visual cueing due to language barrier with proper walker to self distance and safety with turns. Pt had difficulty WBing thru L LE due to pain. Required increased time. Gait Pattern: Step-to pattern;Trunk flexed;Decreased stance time - left Gait velocity: slow    Exercises  Unable to tolerate 2nd pain level   PT Goals                                                                progressing    Visit Information  Last PT Received On: 09/25/12 Assistance Needed: +1    Subjective Data      Cognition  Cognition Arousal/Alertness: Awake/alert Behavior During Therapy: WFL for tasks assessed/performed Overall Cognitive Status: Within Functional Limits for tasks assessed    Balance     End of Session PT - End of Session Equipment Utilized During Treatment: Gait belt Activity Tolerance: Patient limited by fatigue;Patient limited by pain Patient left: in chair;with call bell/phone within reach;with family/visitor present   Felecia Shelling  PTA [facility]  Acute  Rehab Pager      [phone]

## 2012-09-25 NOTE — Progress Notes (Signed)
Physical Therapy Treatment Patient Details Name: Evelyn Anderson MRN: [id number] DOB: Jul 05, 1955 Today's Date: 09/25/2012 Time: 4098-1191 PT Time Calculation (min): 27 min  PT Assessment / Plan / Recommendation Comments on Treatment Session  POD # 2 L TKR am session.  Assisted pt OOB to amb to BR to void then amb to recliner.  Pt required increased time and demon difficulty WBing thru L LE due to pain despite being pre medicated.      Follow Up Recommendations  SNF     Does the patient have the potential to tolerate intense rehabilitation     Barriers to Discharge        Equipment Recommendations  Rolling walker with 5" wheels    Recommendations for Other Services    Frequency 7X/week   Plan Discharge plan remains appropriate    Precautions / Restrictions Precautions Precautions: Knee;Fall Restrictions Weight Bearing Restrictions: No LLE Weight Bearing: Weight bearing as tolerated   Pertinent Vitals/Pain Expressed quite a bit of pain during agit with decreased WBing and facial grim. ICE applied   Mobility  Bed Mobility Bed Mobility: Sit to Supine Supine to Sit: 4: Min assist Details for Bed Mobility Assistance: Min assist to support L LE and increased time Transfers Transfers: Sit to Stand;Stand to Sit Sit to Stand: 4: Min guard;4: Min assist;From bed;From toilet Stand to Sit: 4: Min guard;4: Min assist;To toilet;To chair/3-in-1 Details for Transfer Assistance: 50% visual cueing/hand over hand cueing due to language barrier.  Assisted pt OOB to BR then to recliner with increased time. Ambulation/Gait Ambulation/Gait Assistance: 4: Min assist Ambulation Distance (Feet): 20 Feet (8 feet to BR then 12 feet to recliner) Assistive device: Rolling walker Ambulation/Gait Assistance Details: 25% visual cueing due to language barrier with proper walker to self distance and safety with turns. Pt had difficulty WBing thru L LE due to pain. Required increased time. Gait Pattern:  Step-to pattern;Trunk flexed;Decreased stance time - left Gait velocity: slow     PT Goals                                              progressing    Visit Information  Last PT Received On: 09/25/12 Assistance Needed: +1    Subjective Data      Cognition       Balance     End of Session PT - End of Session Equipment Utilized During Treatment: Gait belt Activity Tolerance: Patient limited by fatigue;Patient limited by pain Patient left: in chair;with call bell/phone within reach;with family/visitor present   Felecia Shelling  PTA [facility]  Acute  Rehab Pager      [phone]

## 2012-09-26 ENCOUNTER — Inpatient Hospital Stay
Admission: RE | Admit: 2012-09-26 | Discharge: 2012-10-28 | Disposition: A | Discharge status: Home / Self Care | Payer: Medicaid Other | Source: Ambulatory Visit | Attending: Internal Medicine | Admitting: Internal Medicine

## 2012-09-26 DIAGNOSIS — E119 Type 2 diabetes mellitus without complications: Secondary | ICD-10-CM | POA: Diagnosis present

## 2012-09-26 LAB — GLUCOSE, CAPILLARY: Glucose-Capillary: 96 mg/dL (ref 70–99)

## 2012-09-26 NOTE — Progress Notes (Signed)
Subjective: 3 Days Post-Op Procedure(s) (LRB): LEFT TOTAL KNEE ARTHROPLASTY (Left) Patient reports pain as mild.  Pt c/o some nausea.  No f/c/v.  Objective: Vital signs in last 24 hours: Temp:  [97.5 F (36.4 C)-98.3 F (36.8 C)] 98.3 F (36.8 C) (05/31 0500) Pulse Rate:  [78-89] 78 (05/31 0500) Resp:  [16-18] 18 (05/31 0800) BP: (117-145)/(76-81) 117/76 mmHg (05/31 0500) SpO2:  [97 %] 97 % (05/31 0500)  Intake/Output from previous day: 05/30 0701 - 05/31 0700 In: 1260 [P.O.:780; I.V.:480] Out: 3150 [Urine:3150] Intake/Output this shift:     Recent Labs  09/24/12 0413 09/25/12 0438  HGB 9.1* 9.7*    Recent Labs  09/24/12 0413 09/25/12 0438  WBC 8.8 8.0  RBC 3.44* 3.60*  HCT 28.0* 29.6*  PLT 240 214    Recent Labs  09/24/12 0413 09/25/12 0438  NA 135 133*  K 3.5 3.6  CL 100 98  CO2 28 28  BUN 15 12  CREATININE 0.97 1.01  GLUCOSE 173* 171*  CALCIUM 8.5 9.1   No results found for this basename: LABPT, INR,  in the last 72 hours  PE:  L knee dressed and dry.  NVI at L LE>  Assessment/Plan: 3 Days Post-Op Procedure(s) (LRB): LEFT TOTAL KNEE ARTHROPLASTY (Left) Discharge to SNF  Evelyn Anderson, [facility] 09/26/2012, 9:38 AM

## 2012-09-26 NOTE — Progress Notes (Signed)
Pt d/c'd to [facility]. Report given to nurse.

## 2012-09-26 NOTE — Progress Notes (Signed)
Per MD, Pt ready for d/c.  Notified RN, Pt, family and facility.  Facility has all necessary paperwork to receive Pt, as Weekday CSW provided it yesterday.  Facility ready to receive Pt.  Arranged for transportation.  Providence Crosby, LCSWA Clinical Social Work [phone]

## 2012-09-26 NOTE — Care Management Note (Signed)
Per CSW pt request RW for use at facility. [facility] weekend DME rep Jermaine notified. [facility] rep to discuss with family at bedside concerning delivery schedule. MD ordered. Pt to SNF. No other needs identified from CM.  Roxy Manns Marlene Pfluger,RN,BSN [phone]

## 2012-09-26 NOTE — Progress Notes (Signed)
Physical Therapy Treatment Patient Details Name: Evelyn Anderson MRN: [id number] DOB: [birth year] Today's Date: 09/26/2012 Time: 4098-1191 PT Time Calculation (min): 46 min  PT Assessment / Plan / Recommendation Comments on Treatment Session  Pt progressing much better today than yesterday and she states she has much less pain today.  Ready for D/C to [facility].     Follow Up Recommendations  SNF     Does the patient have the potential to tolerate intense rehabilitation     Barriers to Discharge        Equipment Recommendations  Rolling walker with 5" wheels    Recommendations for Other Services    Frequency 7X/week   Plan Discharge plan remains appropriate    Precautions / Restrictions Precautions Precautions: Knee;Fall Restrictions Weight Bearing Restrictions: No LLE Weight Bearing: Weight bearing as tolerated   Pertinent Vitals/Pain 7/10 pain, ice packs applied    Mobility  Bed Mobility Bed Mobility: Not assessed Transfers Transfers: Stand to Sit;Sit to Stand Sit to Stand: 5: Supervision;With armrests;From chair/3-in-1 Stand to Sit: 5: Supervision;With upper extremity assist;With armrests;To chair/3-in-1 Details for Transfer Assistance: Pt able to correctly use UEs on chair for safety.  Ambulation/Gait Ambulation/Gait Assistance: 4: Min guard Ambulation Distance (Feet): 250 Feet Assistive device: Rolling walker Ambulation/Gait Assistance Details: Cues for increasing UE WB to decrease antalgic gait pattern and to maintain upright posture.  Gait Pattern: Step-to pattern;Trunk flexed;Decreased stance time - left Gait velocity: slow    Exercises Total Joint Exercises Ankle Circles/Pumps: AROM;Both;10 reps Quad Sets: AROM;Left;10 reps Heel Slides: AAROM;Left;10 reps Straight Leg Raises: AAROM;Left;10 reps Goniometric ROM: pt with approx 70 deg knee flex   PT Diagnosis:    PT Problem List:   PT Treatment Interventions:     PT Goals Acute Rehab PT Goals PT  Goal Formulation: With patient/family Time For Goal Achievement: 10/01/12 Potential to Achieve Goals: Good Pt will go Sit to Stand: with supervision PT Goal: Sit to Stand - Progress: Met Pt will go Stand to Sit: with supervision PT Goal: Stand to Sit - Progress: Met Pt will Ambulate: 51 - 150 feet;with supervision PT Goal: Ambulate - Progress: Progressing toward goal Pt will Perform Home Exercise Program: with supervision, verbal cues required/provided PT Goal: Perform Home Exercise Program - Progress: Progressing toward goal  Visit Information  Last PT Received On: 09/26/12 Assistance Needed: +1    Subjective Data  Subjective: Pt states she feels much better today than yesterday and wants to walk.  Patient Stated Goal: none; daughter states pt "does not do much"   Cognition  Cognition Arousal/Alertness: Awake/alert Behavior During Therapy: WFL for tasks assessed/performed Overall Cognitive Status: Within Functional Limits for tasks assessed    Balance  Balance Balance Assessed: Yes Dynamic Standing Balance Dynamic Standing - Balance Support: During functional activity;No upper extremity supported Dynamic Standing - Level of Assistance: 4: Min assist Dynamic Standing - Balance Activities: Lateral lean/weight shifting;Forward lean/weight shifting Dynamic Standing - Comments: Pt able to stand without UE support at mostly min/guard assist for safety to perform self care activity.   End of Session PT - End of Session Activity Tolerance: Patient tolerated treatment well;Patient limited by pain Patient left: in chair;with call bell/phone within reach;with family/visitor present Nurse Communication: Mobility status   GP     Vista Deck 09/26/2012, 9:59 AM

## 2012-09-26 NOTE — Progress Notes (Signed)
Clinical Social Work Department CLINICAL SOCIAL WORK PLACEMENT NOTE 09/26/2012  Patient:  Evelyn Anderson, Evelyn Anderson  Account Number:  [id number] Admit date:  09/23/2012  Clinical Social Worker:  Cori Razor, LCSW  Date/time:  09/24/2012 02:38 PM  Clinical Social Work is seeking post-discharge placement for this patient at the following level of care:   SKILLED NURSING   (*CSW will update this form in Epic as items are completed)   09/24/2012  Patient/family provided with [facility] Department of Clinical Social Work's list of facilities offering this level of care within the geographic area requested by the patient (or if unable, by the patient's family).    Patient/family informed of their freedom to choose among providers that offer the needed level of care, that participate in Medicare, Medicaid or managed care program needed by the patient, have an available bed and are willing to accept the patient.  09/24/2012  Patient/family informed of [facility]' ownership interest in [facility], as well as of the fact that they are under no obligation to receive care at this facility.  PASARR submitted to [employer] on 09/24/2012 PASARR number received from [employer] on 09/24/2012  FL2 transmitted to all facilities in geographic area requested by pt/family on   FL2 transmitted to all facilities within larger geographic area on   Patient informed that his/her managed care company has contracts with or will negotiate with  certain facilities, including the following:     Patient/family informed of bed offers received:  09/24/2012 Patient chooses bed at [facility] Physician recommends and patient chooses bed at    Patient to be transferred to [facility] on  09/26/2012 Patient to be transferred to facility by EMS  The following physician request were entered in Epic:   Additional Comments:  Cori Razor LCSW [phone]

## 2012-09-28 ENCOUNTER — Non-Acute Institutional Stay (SKILLED_NURSING_FACILITY): Payer: Medicaid Other | Admitting: Internal Medicine

## 2012-09-28 ENCOUNTER — Other Ambulatory Visit: Payer: Self-pay | Admitting: *Deleted

## 2012-09-28 DIAGNOSIS — E1149 Type 2 diabetes mellitus with other diabetic neurological complication: Secondary | ICD-10-CM

## 2012-09-28 DIAGNOSIS — R29898 Other symptoms and signs involving the musculoskeletal system: Secondary | ICD-10-CM

## 2012-09-28 DIAGNOSIS — Z96659 Presence of unspecified artificial knee joint: Secondary | ICD-10-CM

## 2012-09-28 MED ORDER — HYDROCODONE-ACETAMINOPHEN 5-325 MG PO TABS: ORAL_TABLET | ORAL | Status: DC

## 2012-09-28 NOTE — Progress Notes (Signed)
Patient ID: Evelyn Anderson, female   DOB: 11-29-1955, 57 y.o.   MRN: [id number] Facility; [facility].  Chief complaint; this is an admission to SNF, status post a home health, May 28, through 09/26/2012.  History; this is a 57 year old woman who had a history of severe pain and functional disability in the left knee due to arthritis. That was refractory to medical management. She was admitted electively for left total knee replacement. Other than postoperative anemia. She appears to tolerate the procedure well.  Past medical history; type 2 diabetes on insulin, hypertension, hypothyroidism, on replacement, seasonal allergic rhinitis, gastroesophageal reflux disease, osteoarthritis, peripheral neuropathy, question celiac disease, on a gluten-free diet.  Medications; ASA 325 one by mouth twice a day presumably DVT prophylaxis, L. simplicity, 2 capsules 3 times a day, Zyrtec 10 by mouth daily, clonidine 0.1, by mouth every evening, dicyclomine, 10 mg by mouth twice a day, Colace 100 by mouth twice a day, ferrous sulfate 325 by mouth 3 times a day with meals, fish oil 1 g by mouth daily, glutamine one tab by mouth daily, Synthroid, 100 mcg by mouth daily, losartan/hydrochlorothiazide 50/12.5, one tab daily, MiraLax 17 g by mouth daily, ranitidine 300 mg by mouth daily, simvastatin 40 mg by mouth daily, vitamin D 3 5000 international units, one by mouth daily, glipizide, 10 mg by mouth twice a day, Lantus insulin 10 units subcutaneous each bedtime, Januvia one half of 100 mg tablet equals 50 mg qd,  Socially; this is a [place] speaking woman, who lives in [place] with family members. Nonsmoker. Fairly limited and normal ability over the last 6 months due to pain in the left knee. Did not use a cane or a walker. Daughter gives her her insulin.  Review of systems; all through the daughter interpreting. Skin; generalized pruritus Respiratory no shortness of breath. GI; frequency of bowel movements,  which happen after meals. Apparently, a long-standing problem. She is on a gluten-free diet.  Musculoskeletal; has some pain in the right knee, shoulders seem fine.  Physical exam; Skin no rash Neck question small goiter. Respiratory clear entry bilaterally. No wheezing. Cardiac heart sounds are normal. No murmurs. No bruits. Abdomen somewhat obese, ventral midline hernia, small peri-umbilicus hernia. No tenderness. No other masses. No liver no spleen. Extremities left knee incision is clean and well approximated. No evidence of infection. Right knee appears stable. Circulation brisk dorsalis pedis pulses bilaterally. Neurologic; 3+ out of 5 hip flexor and abductor strength bilaterally. However, her knee and ankle jerks on the right arm maintain plantar responses downgoing.  Impression/plan #1 status post left total knee replacement; aspirin is inadequate for DVT prophylaxis. I am therefore going to change her to xarelto for 2 weeks. She apparently was on aspirin, PTA. Will restart this after xarelto completed. #2 significant proximal lower extremity weakness, which I think is probably disuse. Although she is listed as having diabetic neuropathy. The maintained reflexes in her knees and ankle jerks would make this unlikely cause of this weekness . #3 fecal urgency and frequency; lot of this sounds like irritable bowel, although she is on a gluten-free diet. This is apparently a long-standing problem.  #4 April thyroidism, on replacement. #5 postoperative, anemia. We'll continue the iron. #6 type 2 diabetes listed as having diabetic neuropathy or accompanying neuropathy.  #7 pruritus;  This may be narcotic induced, will follow. #8 hyperlipidemia on a statin, this will also need to be monitored, check CK #7 hypertension. We'll monitor while she is here

## 2012-09-29 LAB — GLUCOSE, CAPILLARY
Glucose-Capillary: 136 mg/dL — ABNORMAL HIGH (ref 70–99)
Glucose-Capillary: 133 mg/dL — ABNORMAL HIGH (ref 70–99)

## 2012-10-01 LAB — GLUCOSE, CAPILLARY: Glucose-Capillary: 115 mg/dL — ABNORMAL HIGH (ref 70–99)

## 2012-10-02 LAB — GLUCOSE, CAPILLARY
Glucose-Capillary: 119 mg/dL — ABNORMAL HIGH (ref 70–99)
Glucose-Capillary: 220 mg/dL — ABNORMAL HIGH (ref 70–99)

## 2012-10-03 LAB — GLUCOSE, CAPILLARY
Glucose-Capillary: 120 mg/dL — ABNORMAL HIGH (ref 70–99)
Glucose-Capillary: 177 mg/dL — ABNORMAL HIGH (ref 70–99)

## 2012-10-05 LAB — GLUCOSE, CAPILLARY: Glucose-Capillary: 122 mg/dL — ABNORMAL HIGH (ref 70–99)

## 2012-10-06 LAB — GLUCOSE, CAPILLARY
Glucose-Capillary: 109 mg/dL — ABNORMAL HIGH (ref 70–99)
Glucose-Capillary: 150 mg/dL — ABNORMAL HIGH (ref 70–99)

## 2012-10-07 LAB — GLUCOSE, CAPILLARY
Glucose-Capillary: 128 mg/dL — ABNORMAL HIGH (ref 70–99)
Glucose-Capillary: 128 mg/dL — ABNORMAL HIGH (ref 70–99)

## 2012-10-09 LAB — GLUCOSE, CAPILLARY
Glucose-Capillary: 167 mg/dL — ABNORMAL HIGH (ref 70–99)
Glucose-Capillary: 173 mg/dL — ABNORMAL HIGH (ref 70–99)

## 2012-10-10 LAB — GLUCOSE, CAPILLARY: Glucose-Capillary: 123 mg/dL — ABNORMAL HIGH (ref 70–99)

## 2012-10-11 LAB — GLUCOSE, CAPILLARY: Glucose-Capillary: 127 mg/dL — ABNORMAL HIGH (ref 70–99)

## 2012-10-12 LAB — GLUCOSE, CAPILLARY: Glucose-Capillary: 142 mg/dL — ABNORMAL HIGH (ref 70–99)

## 2012-10-13 LAB — GLUCOSE, CAPILLARY: Glucose-Capillary: 104 mg/dL — ABNORMAL HIGH (ref 70–99)

## 2012-10-14 LAB — GLUCOSE, CAPILLARY

## 2012-10-15 LAB — GLUCOSE, CAPILLARY: Glucose-Capillary: 104 mg/dL — ABNORMAL HIGH (ref 70–99)

## 2012-10-16 LAB — GLUCOSE, CAPILLARY
Glucose-Capillary: 183 mg/dL — ABNORMAL HIGH (ref 70–99)
Glucose-Capillary: 119 mg/dL — ABNORMAL HIGH (ref 70–99)

## 2012-10-17 LAB — GLUCOSE, CAPILLARY
Glucose-Capillary: 209 mg/dL — ABNORMAL HIGH (ref 70–99)
Glucose-Capillary: 245 mg/dL — ABNORMAL HIGH (ref 70–99)

## 2012-10-18 LAB — GLUCOSE, CAPILLARY: Glucose-Capillary: 108 mg/dL — ABNORMAL HIGH (ref 70–99)

## 2012-10-19 LAB — GLUCOSE, CAPILLARY
Glucose-Capillary: 211 mg/dL — ABNORMAL HIGH (ref 70–99)
Glucose-Capillary: 152 mg/dL — ABNORMAL HIGH (ref 70–99)

## 2012-10-20 LAB — GLUCOSE, CAPILLARY: Glucose-Capillary: 231 mg/dL — ABNORMAL HIGH (ref 70–99)

## 2012-10-21 LAB — GLUCOSE, CAPILLARY
Glucose-Capillary: 110 mg/dL — ABNORMAL HIGH (ref 70–99)
Glucose-Capillary: 112 mg/dL — ABNORMAL HIGH (ref 70–99)

## 2012-10-22 LAB — GLUCOSE, CAPILLARY: Glucose-Capillary: 106 mg/dL — ABNORMAL HIGH (ref 70–99)

## 2012-10-23 ENCOUNTER — Non-Acute Institutional Stay (SKILLED_NURSING_FACILITY): Payer: Medicaid Other | Admitting: Internal Medicine

## 2012-10-23 DIAGNOSIS — E785 Hyperlipidemia, unspecified: Secondary | ICD-10-CM

## 2012-10-23 DIAGNOSIS — Z96652 Presence of left artificial knee joint: Secondary | ICD-10-CM

## 2012-10-23 DIAGNOSIS — Z96659 Presence of unspecified artificial knee joint: Secondary | ICD-10-CM

## 2012-10-23 DIAGNOSIS — I10 Essential (primary) hypertension: Secondary | ICD-10-CM

## 2012-10-23 DIAGNOSIS — E119 Type 2 diabetes mellitus without complications: Secondary | ICD-10-CM

## 2012-10-23 DIAGNOSIS — D500 Iron deficiency anemia secondary to blood loss (chronic): Secondary | ICD-10-CM

## 2012-10-23 DIAGNOSIS — E039 Hypothyroidism, unspecified: Secondary | ICD-10-CM

## 2012-10-23 LAB — GLUCOSE, CAPILLARY: Glucose-Capillary: 117 mg/dL — ABNORMAL HIGH (ref 70–99)

## 2012-10-24 DIAGNOSIS — E785 Hyperlipidemia, unspecified: Secondary | ICD-10-CM | POA: Insufficient documentation

## 2012-10-24 DIAGNOSIS — I10 Essential (primary) hypertension: Secondary | ICD-10-CM | POA: Insufficient documentation

## 2012-10-24 DIAGNOSIS — E039 Hypothyroidism, unspecified: Secondary | ICD-10-CM | POA: Insufficient documentation

## 2012-10-24 LAB — GLUCOSE, CAPILLARY
Glucose-Capillary: 114 mg/dL — ABNORMAL HIGH (ref 70–99)
Glucose-Capillary: 220 mg/dL — ABNORMAL HIGH (ref 70–99)

## 2012-10-24 NOTE — Progress Notes (Signed)
Patient ID: Evelyn Anderson, female   DOB: 08/22/1955, 57 y.o.   MRN: [id number]  This is a discharge visit.  Level of care skilled.  Facility [name]               .   Chief complaint; discharge note.   History; this is a 57 year old woman who had a history of severe pain and functional disability in the left knee due to arthritis. That was refractory to medical management. She was admitted electively for left total knee replacement. Other than postoperative anemia.she appeared to have tolerated the procedure well-her hemoglobin appears to be trending up she is on iron She has been seen by orthopedics and cleared for discharge with recommendation for outpatient therapy.  She is a type II diabetic on numerous agents including Lantus and oral agents-blood sugars appear to be fairly satisfactory 86-160 2 in the morning--in the lower 100s to low 200s later dayparts mainly in the mid to low 100s l.   Past medical history; type 2 diabetes on insulin, hypertension, hypothyroidism, on replacement, seasonal allergic rhinitis, gastroesophageal reflux disease, osteoarthritis, peripheral neuropathy, question celiac disease, on a gluten-free diet.   Medications--reviewed per [facility]  ,   Socially; this is a [place] speaking woman, who lives in [place] with family members. Nonsmoker. Fairly limited and normal ability over the last 6 months due to pain in the left knee. Did not use a cane or a walker. Daughter gives her her insulin.   Review of systems  limited secondary to language challenges.  . Skin; generalized pruritus Respiratory no shortness of breath. As been noted Cardiac-no complaints of chest pain GI; frequency of bowel movements, which happen after meals. Apparently, a long-standing problem. She is on a gluten-free diet. This is not new and has not really been a big issue apparently during her stay here   Musculoskeletal;--does have knee pain at times does take her Vicodin  frequently   Physical exam; Temperature 98.1 pulse 68 respirations 20 blood pressure 150/70-132/64-in this range Gen.-pleasant middle-aged female in no distress lying comfortably in bed Skin no rash Neck question small goiter. Respiratory clear entry bilaterally. No wheezing. Cardiac heart sounds are normal. No murmurs. No bruits. Abdomen somewhat obese, ventral midline hernia, small peri-umbilicus hernia. No tenderness. No other masses. No liver no spleen-bowel sounds are positive. Extremities left knee incision is clean and well approximated.--Scar appears to be well healed No evidence of infection. Right knee appears stable.--She ambulates well with a walker and frequently seen ambulating around the facility Circulation brisk dorsalis pedis pulses bilaterally--no significant lower extremity edema. Neurologic; grossly intact no focal deficits appreciated mass cranial nerves appear grossly intact  Labs.  09/27/2012.  TSH 2.0-1.  Sodium 136 potassium 3.6 BUN 20 creatinine 1.22.  WBC 7.2 hemoglobin 9.3 platelets 309.  CK-45        Impression/plan #1 status post left total knee replacement;-he appears to have rehabilitation quite well --will need outpatient therapy-she had been on Xeralto but this has been discontinued-Will restart aspirin    .     #2 hypothyroidism-on replacement recent TSH satisfactory #3 postoperative, anemia. We'll continue the iron--update CBC for. #4 type 2 diabetes listed as having diabetic neuropathy or accompanying neuropathy. --CBGs have been satisfactory . #5 hyperlipidemia on a statin, --since her stay has been short Will defer to primary care provider for followup #6 hypertension. I did not see consistent elevations--Will update metabolic panel  Overall she appears to be doing well has strong family support-she does  live with her daughter who is very supportive-will need followup by primary care provider as well as orthopedics and outpatient  therapy     [phone] note greater than 30 minutes spent preparing this discharge summary

## 2012-10-26 LAB — GLUCOSE, CAPILLARY: Glucose-Capillary: 141 mg/dL — ABNORMAL HIGH (ref 70–99)

## 2012-10-27 LAB — GLUCOSE, CAPILLARY: Glucose-Capillary: 253 mg/dL — ABNORMAL HIGH (ref 70–99)

## 2014-04-26 ENCOUNTER — Other Ambulatory Visit (HOSPITAL_COMMUNITY): Payer: Self-pay | Admitting: Family Medicine

## 2014-04-26 DIAGNOSIS — Z1231 Encounter for screening mammogram for malignant neoplasm of breast: Secondary | ICD-10-CM

## 2014-05-10 ENCOUNTER — Ambulatory Visit (HOSPITAL_COMMUNITY): Payer: Medicaid Other

## 2014-05-16 ENCOUNTER — Ambulatory Visit (HOSPITAL_COMMUNITY)
Admission: RE | Admit: 2014-05-16 | Discharge: 2014-05-16 | Disposition: A | Discharge status: Home / Self Care | Payer: Medicaid Other | Source: Ambulatory Visit | Attending: Family Medicine | Admitting: Family Medicine

## 2014-05-16 DIAGNOSIS — Z1231 Encounter for screening mammogram for malignant neoplasm of breast: Secondary | ICD-10-CM | POA: Diagnosis present

## 2014-08-29 ENCOUNTER — Other Ambulatory Visit (HOSPITAL_COMMUNITY): Payer: Self-pay | Admitting: Family Medicine

## 2014-08-29 DIAGNOSIS — I6529 Occlusion and stenosis of unspecified carotid artery: Secondary | ICD-10-CM

## 2014-08-31 ENCOUNTER — Encounter (HOSPITAL_COMMUNITY): Payer: Medicaid Other

## 2014-09-12 ENCOUNTER — Ambulatory Visit (HOSPITAL_COMMUNITY)
Admission: RE | Admit: 2014-09-12 | Discharge: 2014-09-12 | Disposition: A | Discharge status: Home / Self Care | Payer: Medicaid Other | Source: Ambulatory Visit | Attending: Family Medicine | Admitting: Family Medicine

## 2014-09-12 DIAGNOSIS — I6523 Occlusion and stenosis of bilateral carotid arteries: Secondary | ICD-10-CM | POA: Diagnosis not present

## 2014-09-12 DIAGNOSIS — I10 Essential (primary) hypertension: Secondary | ICD-10-CM | POA: Insufficient documentation

## 2014-09-12 DIAGNOSIS — E119 Type 2 diabetes mellitus without complications: Secondary | ICD-10-CM | POA: Diagnosis not present

## 2014-09-12 DIAGNOSIS — I6529 Occlusion and stenosis of unspecified carotid artery: Secondary | ICD-10-CM

## 2014-09-12 NOTE — Progress Notes (Signed)
VASCULAR LAB PRELIMINARY  PRELIMINARY  PRELIMINARY  PRELIMINARY  Carotid Dopplers completed.    Preliminary report:  1-39% ICA stenosis.  Vertebral artery flow is antegrade.   Cherrill Scrima, RVT 09/12/2014, 9:29 AM

## 2014-09-30 ENCOUNTER — Encounter: Payer: Self-pay | Admitting: Gastroenterology

## 2014-12-06 ENCOUNTER — Ambulatory Visit (INDEPENDENT_AMBULATORY_CARE_PROVIDER_SITE_OTHER): Payer: Medicaid Other | Admitting: Gastroenterology

## 2014-12-06 ENCOUNTER — Encounter: Payer: Self-pay | Admitting: Gastroenterology

## 2014-12-06 VITALS — BP 138/80 | HR 71 | Ht 63.0 in | Wt 188.0 lb

## 2014-12-06 DIAGNOSIS — R197 Diarrhea, unspecified: Secondary | ICD-10-CM

## 2014-12-06 DIAGNOSIS — D649 Anemia, unspecified: Secondary | ICD-10-CM

## 2014-12-06 MED ORDER — NA SULFATE-K SULFATE-MG SULF 17.5-3.13-1.6 GM/177ML PO SOLN: 1.0000 | Freq: Once | ORAL | Status: DC

## 2014-12-06 NOTE — Patient Instructions (Signed)
x   ORAL DIABETIC MEDICATION INSTRUCTIONS  The day before your procedure:  Take your diabetic pill as you do normally  The day of your procedure:  Do not take your diabetic pill   We will check your blood sugar levels during the admission process and again in Recovery before discharging you home  ______________________________________________________________________  X   INSULIN (LONG ACTING) MEDICATION INSTRUCTIONS (Lantus, NPH, 70/30, Humulin, Novolin-N, Levemir, )   The day before your procedure:  Take  your regular evening dose    The day of your procedure:  Do not take your morning dose   X   INSULIN (SHORT ACTING) MEDICATION INSTRUCTIONS (Regular, Humulog, Novolog, Apidra, Novolin, Humulin)   The day before your procedure:  Do not take your evening dose   The day of your procedure:  Do not take your morning dose  ______________________________________________________________________                  You have been scheduled for an endoscopy and colonoscopy. Please follow the written instructions given to you at your visit today. Please pick up your prep supplies at the pharmacy within the next 1-3 days. If you use inhalers (even only as needed), please bring them with you on the day of your procedure. Your physician has requested that you go to www.startemmi.com and enter the access code given to you at your visit today. This web site gives a general overview about your procedure. However, you should still follow specific instructions given to you by our office regarding your preparation for the procedure.  uSE iMODIUM EVERY MORNING

## 2014-12-06 NOTE — Assessment & Plan Note (Signed)
I will attempt to get her previous records.  She probably tested Hemoccult-negative negative.  The patient is a begin and she may have a nutritional anemia as well although she does not appear malnourished.

## 2014-12-06 NOTE — Progress Notes (Signed)
_                                                                                                                History of Present Illness:  Evelyn Anderson is a 59 year old [place] female referred at the request of Dr. Landis Martins for evaluation of diarrhea and anemia.  History was provided by the patient's daughter since she does not speak [place].  Several months ago she was seen by her PCP because of fatigue and was found to have a hemoglobin around 5.  She apparently tested Hemoccult-negative.  She was placed on iron.  The patient is a vegan.  She complains of frequent postprandial loose stools which were made worse after a cholecystectomy.  She denies overt GI bleeding.  Her daughter states that she complains of abdominal pain all the time but I cannot pin her down on further details.  She takes ranitidine daily.   Past Medical History  Diagnosis Date  . Hypertension   . Hypothyroidism   . Diabetes mellitus without complication   . Seasonal allergies   . GERD (gastroesophageal reflux disease)   . Arthritis   . Neuromuscular disorder     peripheral neuropathy  . Renal failure    Past Surgical History  Procedure Laterality Date  . Cholecystectomy    . Total knee arthroplasty Left 09/23/2012    Procedure: LEFT TOTAL KNEE ARTHROPLASTY;  Surgeon: Shelda Pal, MD;  Location: [facility] ORS;  Service: Orthopedics;  Laterality: Left;   family history is not on file. Current Outpatient Prescriptions  Medication Sig Dispense Refill  . aspirin EC 325 MG EC tablet Take 1 tablet (325 mg total) by mouth 2 (two) times daily. 60 tablet 0  . cetirizine (ZYRTEC) 10 MG tablet Take 10 mg by mouth every evening.     . Cholecalciferol (VITAMIN D-3) 5000 UNITS TABS Take 1 tablet by mouth daily.    . cloNIDine (CATAPRES) 0.1 MG tablet Take 0.1 mg by mouth every evening.     . dicyclomine (BENTYL) 10 MG capsule Take 10 mg by mouth 2 (two) times daily.    . ferrous sulfate 325 (65 FE) MG tablet Take 1  tablet (325 mg total) by mouth 3 (three) times daily after meals.  3  . fish oil-omega-3 fatty acids 1000 MG capsule Take 1 g by mouth daily.    Marland Kitchen glipiZIDE (GLUCOTROL) 10 MG tablet Take 10 mg by mouth 2 (two) times daily before a meal.    . glucosamine-chondroitin 500-400 MG tablet Take 1 tablet by mouth daily as needed (joint pain).    Marland Kitchen HYDROcodone-acetaminophen (NORCO) 7.5-325 MG per tablet Take 1-2 tablets by mouth every 4 (four) hours. 90 tablet 0  . hydrOXYzine (ATARAX/VISTARIL) 10 MG tablet Take 10 mg by mouth daily as needed for itching.    . insulin glargine (LANTUS) 100 UNIT/ML injection Inject 10 Units into the skin at bedtime.    . L-GLUTAMINE PO Take 1 tablet by mouth daily.    Marland Kitchen levothyroxine (  SYNTHROID, LEVOTHROID) 100 MCG tablet Take 100 mcg by mouth daily before breakfast.    . losartan-hydrochlorothiazide (HYZAAR) 50-12.5 MG per tablet Take 1 tablet by mouth daily before breakfast.    . pregabalin (LYRICA) 50 MG capsule Take 50 mg by mouth daily.    . ranitidine (ZANTAC) 300 MG tablet Take 300 mg by mouth daily before breakfast.    . simvastatin (ZOCOR) 40 MG tablet Take 40 mg by mouth every evening.    . sitaGLIPtin (JANUVIA) 100 MG tablet Take 50 mg by mouth 2 (two) times daily.    Marland Kitchen amLODipine (NORVASC) 10 MG tablet Take 10 mg by mouth daily.    Marland Kitchen ibuprofen (ADVIL,MOTRIN) 600 MG tablet Take 600 mg by mouth at bedtime as needed.    . penicillin v potassium (VEETID) 500 MG tablet Take 500 mg by mouth every 6 (six) hours.     No current facility-administered medications for this visit.   Allergies as of 12/06/2014 - Review Complete 12/06/2014  Allergen Reaction Noted  . Other  09/16/2012    reports that she has never smoked. She has never used smokeless tobacco. She reports that she does not drink alcohol or use illicit drugs.   Review of Systems: Pertinent positive and negative review of systems were noted in the above HPI section. All other review of systems were  otherwise negative.  Vital signs were reviewed in today's medical record Physical Exam: General: Well developed , well nourished, no acute distress Skin: anicteric Head: Normocephalic and atraumatic Eyes:  sclerae anicteric, EOMI Ears: Normal auditory acuity Mouth: No deformity or lesions Neck: Supple, no masses or thyromegaly Lymph Nodes: no lymphadenopathy Lungs: Clear throughout to auscultation Heart: Regular rate and rhythm; no murmurs, rubs or bruits Gastroinestinal: Soft, non tender and non distended. No masses, hepatosplenomegaly or hernias noted. Normal Bowel sounds Rectal:deferred Musculoskeletal: Symmetrical with no gross deformities  Skin: No lesions on visible extremities Pulses:  Normal pulses noted Extremities: No clubbing, cyanosis, edema or deformities noted Neurological: Alert oriented x 4, grossly nonfocal Cervical Nodes:  No significant cervical adenopathy Inguinal Nodes: No significant inguinal adenopathy Psychological:  Alert and cooperative. Normal mood and affect  See Assessment and Plan under Problem List

## 2014-12-06 NOTE — Assessment & Plan Note (Signed)
Patient has postprandial diarrhea and complains of upper and lower abdominal pain.  This may be related to diabetes or perhaps IBS.  Because of both upper and lower abdominal pain and diarrhea I believe it is worthwhile scheduling both upper endoscopy and lower endoscopy.  In addition, history is limited.  We'll make further comments following examination of her blood work.

## 2014-12-16 ENCOUNTER — Telehealth: Payer: Self-pay | Admitting: Gastroenterology

## 2014-12-16 NOTE — Telephone Encounter (Signed)
I don't recall planning on starting any new medicine.

## 2014-12-16 NOTE — Telephone Encounter (Signed)
Dr Arlyce Dice What medication is she talking about that you was suppose to send

## 2014-12-16 NOTE — Telephone Encounter (Signed)
No mediations other than prep planned for patient other than her prep. Suprep  Per Dr Arlyce Dice  No answer

## 2014-12-20 NOTE — Telephone Encounter (Signed)
Patient daughter called back stating that Dr. Arlyce Dice was talking about supplements that patient could take. She is wanting to know what supplements he was suggesting. [phone]

## 2014-12-22 NOTE — Telephone Encounter (Signed)
Dr Kaplan please advise 

## 2014-12-22 NOTE — Telephone Encounter (Signed)
Not sure.  She can take fiber supplements daily and a multivitamin

## 2014-12-28 NOTE — Telephone Encounter (Signed)
Patient can take Fiber suppliments and a multivitamin. Tried to contact Patient twice No Answer

## 2015-01-13 ENCOUNTER — Other Ambulatory Visit (HOSPITAL_COMMUNITY): Payer: Self-pay | Admitting: Family Medicine

## 2015-01-13 ENCOUNTER — Ambulatory Visit (HOSPITAL_COMMUNITY)
Admission: RE | Admit: 2015-01-13 | Discharge: 2015-01-13 | Disposition: A | Discharge status: Home / Self Care | Payer: Medicaid Other | Source: Ambulatory Visit | Attending: Family Medicine | Admitting: Family Medicine

## 2015-01-13 DIAGNOSIS — R52 Pain, unspecified: Secondary | ICD-10-CM

## 2015-01-13 DIAGNOSIS — M179 Osteoarthritis of knee, unspecified: Secondary | ICD-10-CM | POA: Diagnosis not present

## 2015-01-13 DIAGNOSIS — M47897 Other spondylosis, lumbosacral region: Secondary | ICD-10-CM | POA: Diagnosis not present

## 2015-01-13 DIAGNOSIS — M79604 Pain in right leg: Secondary | ICD-10-CM | POA: Insufficient documentation

## 2015-01-13 DIAGNOSIS — M4317 Spondylolisthesis, lumbosacral region: Secondary | ICD-10-CM | POA: Diagnosis not present

## 2015-01-13 DIAGNOSIS — M545 Low back pain: Secondary | ICD-10-CM | POA: Diagnosis not present

## 2015-01-13 DIAGNOSIS — M25551 Pain in right hip: Secondary | ICD-10-CM | POA: Insufficient documentation

## 2015-02-08 ENCOUNTER — Telehealth: Payer: Self-pay | Admitting: Gastroenterology

## 2015-02-08 ENCOUNTER — Other Ambulatory Visit: Payer: Self-pay

## 2015-02-08 NOTE — Telephone Encounter (Signed)
New instructions are in "letters". No answer. Voicemail is full. Cannot leave a message. No other phone numbers listed in her chart.

## 2015-02-09 ENCOUNTER — Encounter: Payer: Medicaid Other | Admitting: Gastroenterology

## 2015-02-09 NOTE — Telephone Encounter (Signed)
Discussed the changes in the timing of the patient's colonoscopy prep. A copy of the new instructions faxed to the daughter. Discussed in detail the medications. She will half the Lantus dose the night before the procedure. No diabetic medications on the day of the procedure. She does not normally take her other medication until later in the day, so she will bring her medications with her to the procedure.

## 2015-02-13 ENCOUNTER — Ambulatory Visit (AMBULATORY_SURGERY_CENTER): Payer: Medicaid Other | Admitting: Gastroenterology

## 2015-02-13 ENCOUNTER — Encounter: Payer: Self-pay | Admitting: Gastroenterology

## 2015-02-13 VITALS — BP 127/74 | HR 78 | Temp 97.7°F | Resp 17 | Ht 63.0 in | Wt 188.0 lb

## 2015-02-13 DIAGNOSIS — R197 Diarrhea, unspecified: Secondary | ICD-10-CM | POA: Diagnosis not present

## 2015-02-13 DIAGNOSIS — K219 Gastro-esophageal reflux disease without esophagitis: Secondary | ICD-10-CM

## 2015-02-13 DIAGNOSIS — D649 Anemia, unspecified: Secondary | ICD-10-CM

## 2015-02-13 LAB — GLUCOSE, CAPILLARY
GLUCOSE-CAPILLARY: 142 mg/dL — AB (ref 65–99)
Glucose-Capillary: 148 mg/dL — ABNORMAL HIGH (ref 65–99)

## 2015-02-13 MED ORDER — SODIUM CHLORIDE 0.9 % IV SOLN: 500.0000 mL | INTRAVENOUS | Status: DC

## 2015-02-13 NOTE — Progress Notes (Signed)
Called to room to assist during endoscopic procedure.  Patient ID and intended procedure confirmed with present staff. Received instructions for my participation in the procedure from the performing physician.  

## 2015-02-13 NOTE — Progress Notes (Signed)
Patient's daughter was the intrepreter. All questions answered to daughter's satisfaction.

## 2015-02-13 NOTE — Patient Instructions (Signed)
Handouts given for AntiReflux and Diverticulosis.  Instructed by Dr. Russella DarStark to increase Ranitidine to 300 mg twice daily. Continue your Bentyl and you may use Align, probiotic. Call Dr Ardell IsaacsStark's office to schedule a return visit for 6 weeks, [phone].    YOU HAD AN ENDOSCOPIC PROCEDURE TODAY AT THE [facility]:   Refer to the procedure report that was given to you for any specific questions about what was found during the examination.  If the procedure report does not answer your questions, please call your gastroenterologist to clarify.  If you requested that your care partner not be given the details of your procedure findings, then the procedure report has been included in a sealed envelope for you to review at your convenience later.  YOU SHOULD EXPECT: Some feelings of bloating in the abdomen. Passage of more gas than usual.  Walking can help get rid of the air that was put into your GI tract during the procedure and reduce the bloating. If you had a lower endoscopy (such as a colonoscopy or flexible sigmoidoscopy) you may notice spotting of blood in your stool or on the toilet paper. If you underwent a bowel prep for your procedure, you may not have a normal bowel movement for a few days.  Please Note:  You might notice some irritation and congestion in your nose or some drainage.  This is from the oxygen used during your procedure.  There is no need for concern and it should clear up in a day or so.  SYMPTOMS TO REPORT IMMEDIATELY:   Following lower endoscopy (colonoscopy or flexible sigmoidoscopy):  Excessive amounts of blood in the stool  Significant tenderness or worsening of abdominal pains  Swelling of the abdomen that is new, acute  Fever of 100F or higher   Following upper endoscopy (EGD)  Vomiting of blood or coffee ground material  New chest pain or pain under the shoulder blades  Painful or persistently difficult swallowing  New shortness of  breath  Fever of 100F or higher  Black, tarry-looking stools  For urgent or emergent issues, a gastroenterologist can be reached at any hour by calling [phone].   DIET: Your first meal following the procedure should be a small meal and then it is ok to progress to your normal diet. Heavy or fried foods are harder to digest and may make you feel nauseous or bloated.  Likewise, meals heavy in dairy and vegetables can increase bloating.  Drink plenty of fluids but you should avoid alcoholic beverages for 24 hours.  ACTIVITY:  You should plan to take it easy for the rest of today and you should NOT DRIVE or use heavy machinery until tomorrow (because of the sedation medicines used during the test).    FOLLOW UP: Our staff will call the number listed on your records the next business day following your procedure to check on you and address any questions or concerns that you may have regarding the information given to you following your procedure. If we do not reach you, we will leave a message.  However, if you are feeling well and you are not experiencing any problems, there is no need to return our call.  We will assume that you have returned to your regular daily activities without incident.  If any biopsies were taken you will be contacted by phone or by letter within the next 1-3 weeks.  Please call us at [phone] if you have not heard about the biopsies  in 3 weeks.    SIGNATURES/CONFIDENTIALITY: You and/or your care partner have signed paperwork which will be entered into your electronic medical record.  These signatures attest to the fact that that the information above on your After Visit Summary has been reviewed and is understood.  Full responsibility of the confidentiality of this discharge information lies with you and/or your care-partner.

## 2015-02-13 NOTE — Op Note (Signed)
[facility] [address] [place], [id number]   COLONOSCOPY PROCEDURE REPORT  PATIENT: Evelyn Anderson, Evelyn Anderson  MR#: [id number] BIRTHDATE: Apr 09, 1956 , 59  yrs. old GENDER: female ENDOSCOPIST: Meryl DareMalcolm T Elmo Shumard, MD, Clementeen GrahamFACG REFERRED BY: Orvan SeenManjeet Achrela, MD PROCEDURE DATE:  02/13/2015 PROCEDURE:   Colonoscopy, diagnostic and Colonoscopy with biopsy First Screening Colonoscopy - Avg.  risk and is 50 yrs.  old or older - No.  Prior Negative Screening - Now for repeat screening. N/A  History of Adenoma - Now for follow-up colonoscopy & has been > or = to 3 yrs.  N/A  Polyps removed today? No Recommend repeat exam, <10 yrs? No ASA CLASS:   Class II INDICATIONS:Clinically significant diarrhea of unexplained origin, anemia, non-specific, and unexplained diarrhea. MEDICATIONS: Monitored anesthesia care and Propofol 250 mg IV DESCRIPTION OF PROCEDURE:   After the risks benefits and alternatives of the procedure were thoroughly explained, informed consent was obtained.  The digital rectal exam revealed no abnormalities of the rectum.   The LB [id number]  endoscope was introduced through the anus and advanced to the cecum, which was identified by both the appendix and ileocecal valve. No adverse events experienced.   The quality of the prep was good.  (Suprep was used)  The instrument was then slowly withdrawn as the colon was fully examined. Estimated blood loss is zero unless otherwise noted in this procedure report.    COLON FINDINGS: There was mild diverticulosis noted at the cecum and in the sigmoid colon.   The colonic mucosa appeared normal at the splenic flexure, in the descending colon, rectum, transverse colon, at the ileocecal valve, in the ascending colon, and at the hepatic flexure.  Multiple random biopsies were performed.  Retroflexed views revealed no abnormalities. The time to cecum = 2.3 Withdrawal time = 8.7   The scope was withdrawn and the procedure  completed. COMPLICATIONS: There were no immediate complications.  ENDOSCOPIC IMPRESSION: 1.   Mild diverticulosis at the cecum and in the sigmoid colon 2.  The colonic mucosa otherwise appeared normal; multiple random biopsies performed  RECOMMENDATIONS: 1.  Await pathology results 2.  High fiber diet with liberal fluid intake. 3.  Continue current colorectal screening recommendations for "routine risk" patients with a repeat colonoscopy in 10 years.  eSigned:  Meryl DareMalcolm T Jaycee Pelzer, MD, [facility] 02/13/2015 8:31 AM

## 2015-02-13 NOTE — Op Note (Addendum)
[facility] [address] [place], [id number]   ENDOSCOPY PROCEDURE REPORT  PATIENT: Evelyn Anderson, [name]  MR#: [id number] BIRTHDATE: August 03, 1955 , 59  yrs. old GENDER: female ENDOSCOPIST: Meryl DareMalcolm T Adelia Baptista, MD, Clementeen GrahamFACG REFERRED BY:  Orvan SeenManjeet Achrela, MD PROCEDURE DATE:  02/13/2015 PROCEDURE:  EGD w/ biopsy ASA CLASS:     Class II INDICATIONS:  history of esophageal reflux, diarrhea, and anemia. MEDICATIONS: Monitored anesthesia care, Residual sedation present, and Propofol 125 mg IV TOPICAL ANESTHETIC: none DESCRIPTION OF PROCEDURE: After the risks benefits and alternatives of the procedure were thoroughly explained, informed consent was obtained.  The LB [id number] endoscope was introduced through the mouth and advanced to the second portion of the duodenum , Without limitations.  The instrument was slowly withdrawn as the mucosa was fully examined.    ESOPHAGUS: The mucosa of the esophagus appeared normal. STOMACH: The mucosa of the stomach appeared normal. DUODENUM: The duodenal mucosa showed no abnormalities in the bulb and 2nd part of the duodenum.  Cold forceps biopsies were taken in the bulb and second portion.  Retroflexed views revealed no abnormalities.     The scope was then withdrawn from the patient and the procedure completed.  COMPLICATIONS: There were no immediate complications.  ENDOSCOPIC IMPRESSION: 1.   The mucosa of the esophagus appeared normal 2.   The mucosa of the stomach appeared normal 3.  The duodenal mucosa showed no abnormalities; cold forceps biopsies were taken in the bulb and second portion  RECOMMENDATIONS: 1.  Anti-reflux regimen 2.  Await pathology results 3.  Increase ranitidine to 300 mg po bid 4.  Increase Bentyl to 10 mg tid ac 5.  Begin Align once daily 6.  Office visit in 6 weeks  eSigned:  Meryl DareMalcolm T Shizue Kaseman, MD, [facility] 02/13/2015 9:15 AM Revised: 02/13/2015 9:15 AM

## 2015-02-13 NOTE — Progress Notes (Signed)
Transferred to recovery room. A/O x3, pleased with MAC   Report to Clydie BraunKaren, [place]

## 2015-02-14 ENCOUNTER — Telehealth: Payer: Self-pay | Admitting: *Deleted

## 2015-02-14 NOTE — Telephone Encounter (Signed)
No answer, mailbox full  Unable to leave message

## 2015-02-16 ENCOUNTER — Encounter: Payer: Medicaid Other | Admitting: Gastroenterology

## 2015-02-22 ENCOUNTER — Encounter: Payer: Self-pay | Admitting: Gastroenterology

## 2015-05-23 ENCOUNTER — Encounter: Payer: Self-pay | Admitting: Dietician

## 2015-05-23 ENCOUNTER — Encounter: Payer: Medicaid Other | Attending: Nephrology | Admitting: Dietician

## 2015-05-23 VITALS — Ht 63.0 in | Wt 198.9 lb

## 2015-05-23 DIAGNOSIS — Z713 Dietary counseling and surveillance: Secondary | ICD-10-CM | POA: Insufficient documentation

## 2015-05-23 DIAGNOSIS — E118 Type 2 diabetes mellitus with unspecified complications: Secondary | ICD-10-CM | POA: Insufficient documentation

## 2015-05-23 DIAGNOSIS — R627 Adult failure to thrive: Secondary | ICD-10-CM | POA: Diagnosis not present

## 2015-05-23 NOTE — Patient Instructions (Signed)
Small frequent meals consisting of small portions rice, corn bread, vegetables, yogurt, fruit Aim for 45 grams of carbohydrate per meal. Avoid a lot of added fat (oil, butter) Consider a protein drink (Mix the powder in 2% milk.) OR Glucerna OR Boost Glucose Control Consider trying Udi Gluten free bread toasted. For some people, milk can cause diarrhea.  If you find that it causes diarhea then avoid. Try plain yogurt. Avoid adding salt to your foods.  Season with herbs. Continue walking most every day. Talk with your doctor about the Januvia.  Could this be causing your diarrhea and stomach pain?

## 2015-05-23 NOTE — Progress Notes (Signed)
Medical Nutrition Therapy:  Appt start time: 1545 end time:  1715.   Assessment:  Primary concerns today: Patient is here today with her daughter.  They have refused an interpretor and daughter is interpreting for her.  They have been referred for diabetes but at this time, patient is not tolerating food.  She is a "vegan" "for religious reasons" but does drink milk and eat yogurt.  After she eats all foods except for tea with a small amount of milk, she has abdominal pain and severe diarrhea.  She has had a full GI workup with endoscopy and colonoscopy with no known cause.  She has been told to avoid milk in the past as well as gluten.  Other hx includes HTN, hyperlipidemia, type 2 diabetes for >14 years, hypothyroidism, CKD stage 3 (GFR 60), and arthritis. She has been gaining increased fluid recently. She has been eating less but gaining weight probably secondary to fluid.  Her husband was murdered at a gas station in 2006 and GI symptoms worsened at that time.    Patient lives with her daughter.  Patient and daughter cook.  Daughter works as a Magazine features editor.  Daughter is concerned for mother and is unsure what she should feed her to help her tolerate food better.  "You are our last straw."  Preferred Learning Style:   No preference indicated   Learning Readiness:   Not ready  Contemplating  Ready  Change in progress   MEDICATIONS: see list to include Januvia   DIETARY INTAKE:  24-hr recall: (incomplete, patient does not eat much and does not tolerate foods well) B ( AM): Rice squares but then stomach pain and diarrhea  Snk ( AM):   L ( PM): enjoys [place] toast Snk ( PM):  D ( PM): corn roti Snk ( PM):  Beverages: tea with 2% milk, water  Usual physical activity: likes to walk 1 hour per day but concerned because stamina has decreased  Estimated energy needs: 1400 calories 158 g carbohydrates 105 g protein 39 g fat  Progress Towards Goal(s):  In  progress.   Nutritional Diagnosis:  NB-1.1 Food and nutrition-related knowledge deficit As related to nutrition for GI symptoms and control of diabetes.  As evidenced by patient report.    Intervention:  Nutrition counseling/education related to best tolerated foods to improve nutrition.  Recommended supplementing with protein shakes as tolerated.  Recommended avoiding added salt.  Discussed diabetes briefly and provided information for the [place] Cuisine and Diabetes.  Discussed concerns of polypharmacy and question if Januvia is making GI symptoms worse.  Discussed to speak with her MD regarding these issues.  Small frequent meals consisting of small portions rice, corn bread, vegetables, yogurt, fruit Aim for 45 grams of carbohydrate per meal. Avoid a lot of added fat (oil, butter) Consider a protein drink (Mix the powder in 2% milk.) OR Glucerna OR Boost Glucose Control Consider trying Udi Gluten free bread toasted. For some people, milk can cause diarrhea.  If you find that it causes diarhea then avoid. Try plain yogurt. Avoid adding salt to your foods.  Season with herbs. Continue walking most every day. Talk with your doctor about the Januvia.  Could this be causing your diarrhea and stomach pain?  Teaching Method Utilized:  Visual Auditory  Handouts given during visit include:  [place] Cuisine and carbohydrate counting  Excerpts from book on [place] cuisine and [place] cooking with sample meal plan.  Barriers to learning/adherence to lifestyle change: GI problems  Demonstrated degree of understanding via:  Teach Back   Monitoring/Evaluation:  Dietary intake, exercise, and body weight prn.  Patient may be traveling out of the country.  Daughter to call as needed.

## 2015-07-18 ENCOUNTER — Other Ambulatory Visit (HOSPITAL_COMMUNITY): Payer: Self-pay | Admitting: *Deleted

## 2015-07-19 ENCOUNTER — Ambulatory Visit (HOSPITAL_COMMUNITY)
Admission: RE | Admit: 2015-07-19 | Discharge: 2015-07-19 | Disposition: A | Discharge status: Home / Self Care | Payer: Medicaid Other | Source: Ambulatory Visit | Attending: Nephrology | Admitting: Nephrology

## 2015-07-19 DIAGNOSIS — D509 Iron deficiency anemia, unspecified: Secondary | ICD-10-CM | POA: Diagnosis not present

## 2015-07-19 MED ORDER — SODIUM CHLORIDE 0.9 % IV SOLN
510.0000 mg | INTRAVENOUS | Status: DC
  Administered 2015-07-19: 510 mg via INTRAVENOUS
  Filled 2015-07-19: qty 17

## 2015-07-25 ENCOUNTER — Other Ambulatory Visit (HOSPITAL_COMMUNITY): Payer: Self-pay | Admitting: *Deleted

## 2015-07-26 ENCOUNTER — Encounter (HOSPITAL_COMMUNITY)
Admission: RE | Admit: 2015-07-26 | Discharge: 2015-07-26 | Disposition: A | Discharge status: Home / Self Care | Payer: Medicaid Other | Source: Ambulatory Visit | Attending: Nephrology | Admitting: Nephrology

## 2015-07-26 DIAGNOSIS — D509 Iron deficiency anemia, unspecified: Secondary | ICD-10-CM | POA: Diagnosis present

## 2015-07-26 MED ORDER — SODIUM CHLORIDE 0.9 % IV SOLN
510.0000 mg | INTRAVENOUS | Status: AC
  Administered 2015-07-26: 510 mg via INTRAVENOUS
  Filled 2015-07-26: qty 17

## 2016-04-16 ENCOUNTER — Other Ambulatory Visit (HOSPITAL_COMMUNITY): Payer: Self-pay | Admitting: *Deleted

## 2016-04-17 ENCOUNTER — Ambulatory Visit (HOSPITAL_COMMUNITY)
Admission: RE | Admit: 2016-04-17 | Discharge: 2016-04-17 | Disposition: A | Discharge status: Home / Self Care | Payer: Medicaid Other | Source: Ambulatory Visit | Attending: Nephrology | Admitting: Nephrology

## 2016-04-17 DIAGNOSIS — D509 Iron deficiency anemia, unspecified: Secondary | ICD-10-CM | POA: Diagnosis present

## 2016-04-17 MED ORDER — SODIUM CHLORIDE 0.9 % IV SOLN
510.0000 mg | INTRAVENOUS | Status: DC
  Administered 2016-04-17: 09:00:00 510 mg via INTRAVENOUS
  Filled 2016-04-17: qty 17

## 2016-04-17 NOTE — Discharge Instructions (Signed)
Ferumoxytol injection What is this medicine? FERUMOXYTOL is an iron complex. Iron is used to make healthy red blood cells, which carry oxygen and nutrients throughout the body. This medicine is used to treat iron deficiency anemia in people with chronic kidney disease. COMMON BRAND NAME(S): Feraheme What should I tell my health care provider before I take this medicine? They need to know if you have any of these conditions: -anemia not caused by low iron levels -high levels of iron in the blood -magnetic resonance imaging (MRI) test scheduled -an unusual or allergic reaction to iron, other medicines, foods, dyes, or preservatives -pregnant or trying to get pregnant -breast-feeding How should I use this medicine? This medicine is for injection into a vein. It is given by a health care professional in a hospital or clinic setting. Talk to your pediatrician regarding the use of this medicine in children. Special care may be needed. What if I miss a dose? It is important not to miss your dose. Call your doctor or health care professional if you are unable to keep an appointment. What may interact with this medicine? This medicine may interact with the following medications: -other iron products What should I watch for while using this medicine? Visit your doctor or healthcare professional regularly. Tell your doctor or healthcare professional if your symptoms do not start to get better or if they get worse. You may need blood work done while you are taking this medicine. You may need to follow a special diet. Talk to your doctor. Foods that contain iron include: whole grains/cereals, dried fruits, beans, or peas, leafy green vegetables, and organ meats (liver, kidney). What side effects may I notice from receiving this medicine? Side effects that you should report to your doctor or health care professional as soon as possible: -allergic reactions like skin rash, itching or hives, swelling of the  face, lips, or tongue -breathing problems -changes in blood pressure -feeling faint or lightheaded, falls -fever or chills -flushing, sweating, or hot feelings -swelling of the ankles or feet Side effects that usually do not require medical attention (report to your doctor or health care professional if they continue or are bothersome): -diarrhea -headache -nausea, vomiting -stomach pain Where should I keep my medicine? This drug is given in a hospital or clinic and will not be stored at home.  2017 Elsevier/Gold Standard (2015-05-18 12:41:49)  

## 2016-04-24 ENCOUNTER — Ambulatory Visit (HOSPITAL_COMMUNITY)
Admission: RE | Admit: 2016-04-24 | Discharge: 2016-04-24 | Disposition: A | Discharge status: Home / Self Care | Payer: Medicaid Other | Source: Ambulatory Visit | Attending: Nephrology | Admitting: Nephrology

## 2016-04-24 DIAGNOSIS — D509 Iron deficiency anemia, unspecified: Secondary | ICD-10-CM | POA: Diagnosis present

## 2016-04-24 MED ORDER — SODIUM CHLORIDE 0.9 % IV SOLN
510.0000 mg | INTRAVENOUS | Status: AC
  Administered 2016-04-24: 510 mg via INTRAVENOUS
  Filled 2016-04-24: qty 17

## 2016-06-12 IMAGING — DX DG LUMBAR SPINE COMPLETE 4+V
5 series · 5 of 5 positions shown · non-contrast
Comparison: None.

CLINICAL DATA: Right-sided low back pain for 3 days. No known
injury.

EXAM:
LUMBAR SPINE - COMPLETE 4+ VIEW

[t lumbar spine ap]
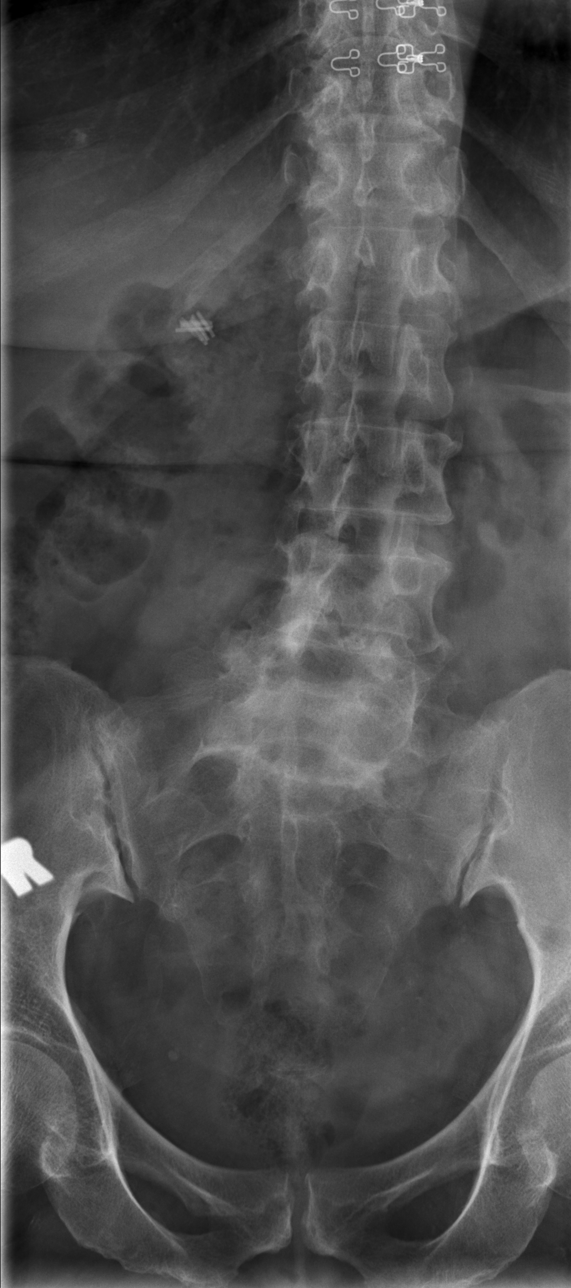

[t lumbar spine obl (1 of 2)]
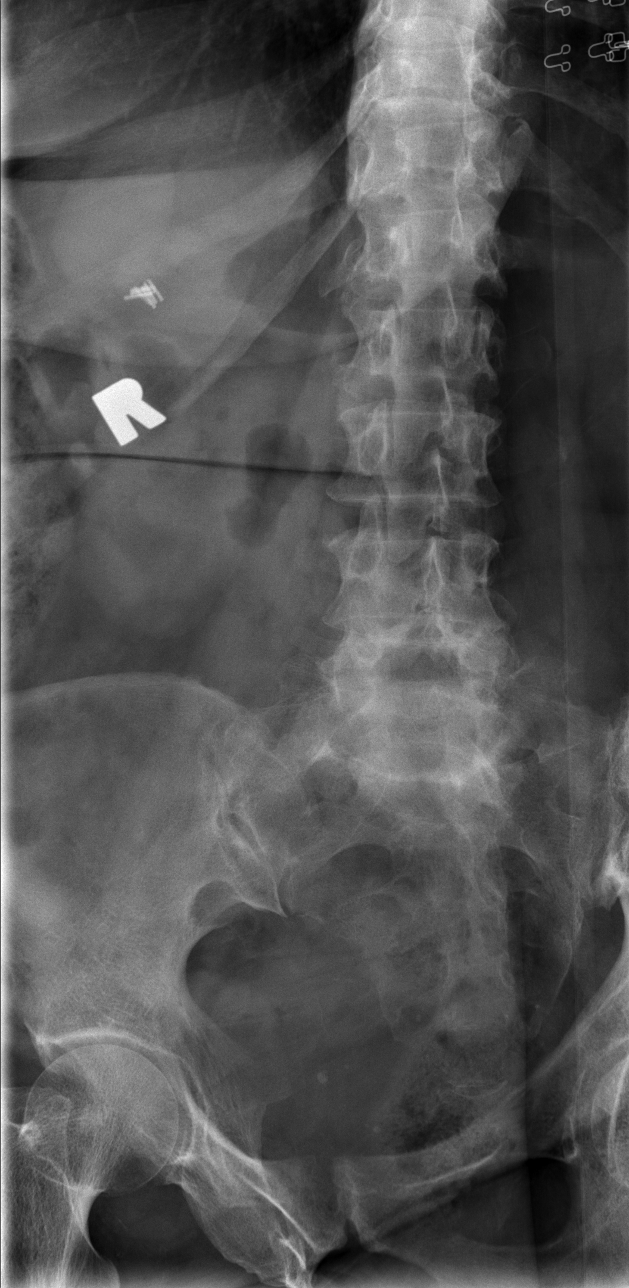

[t lumbar spine obl (2 of 2)]
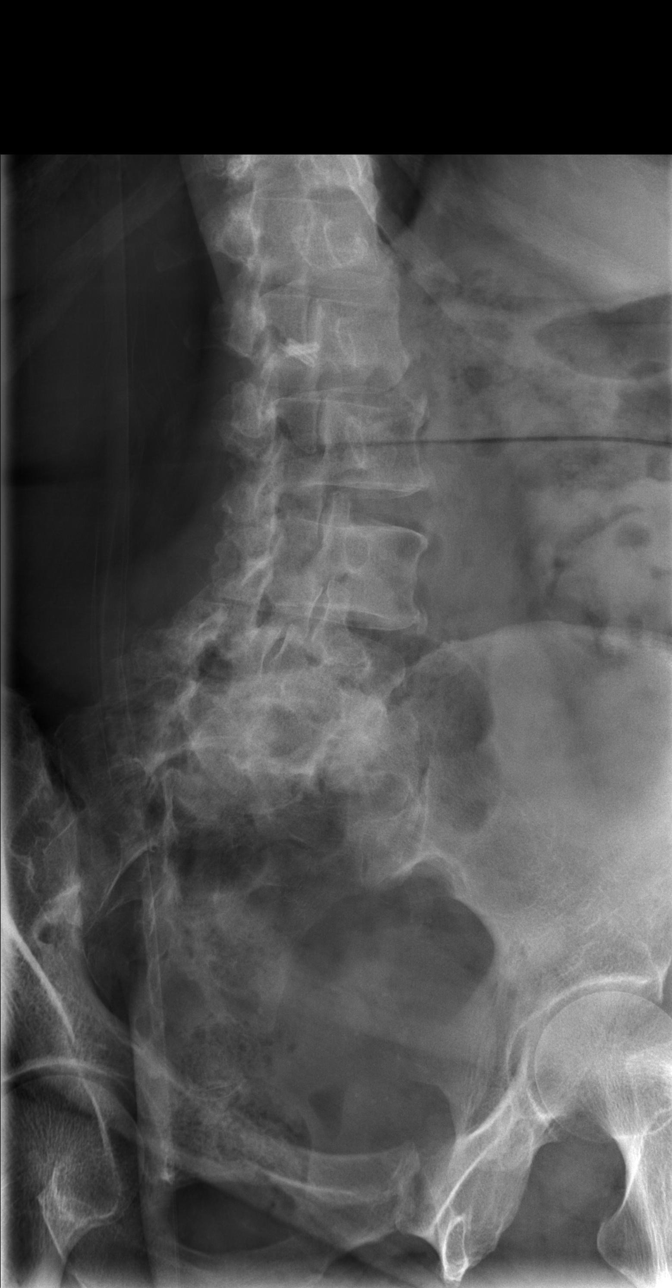

[t lumbar spine lat]
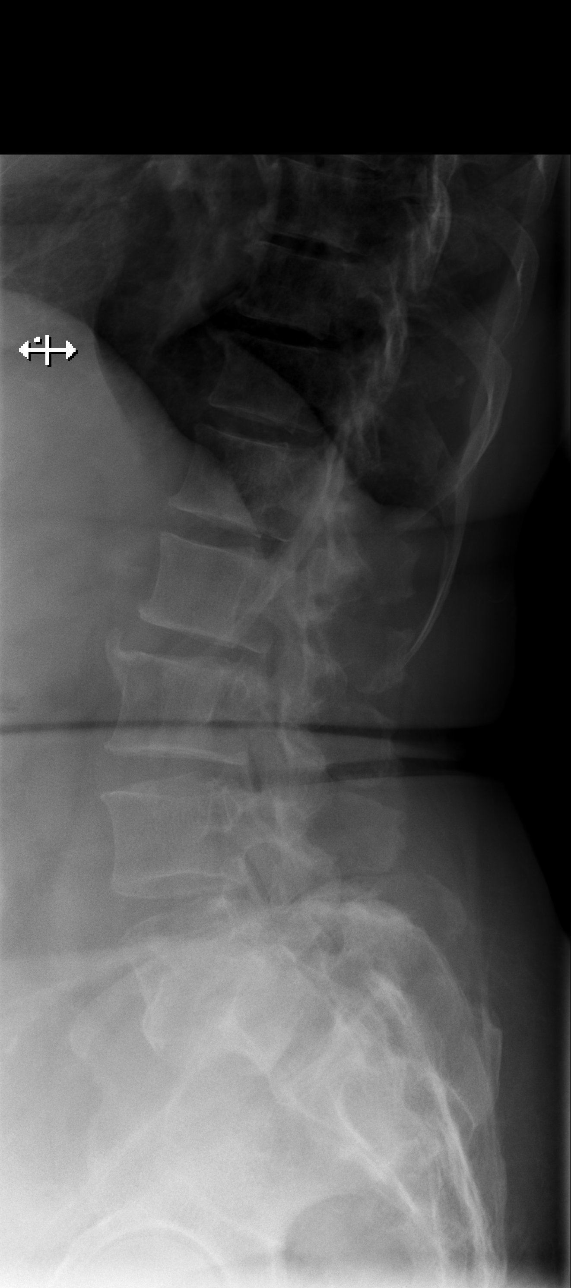

[t lumbar l-5 s-1 spot]
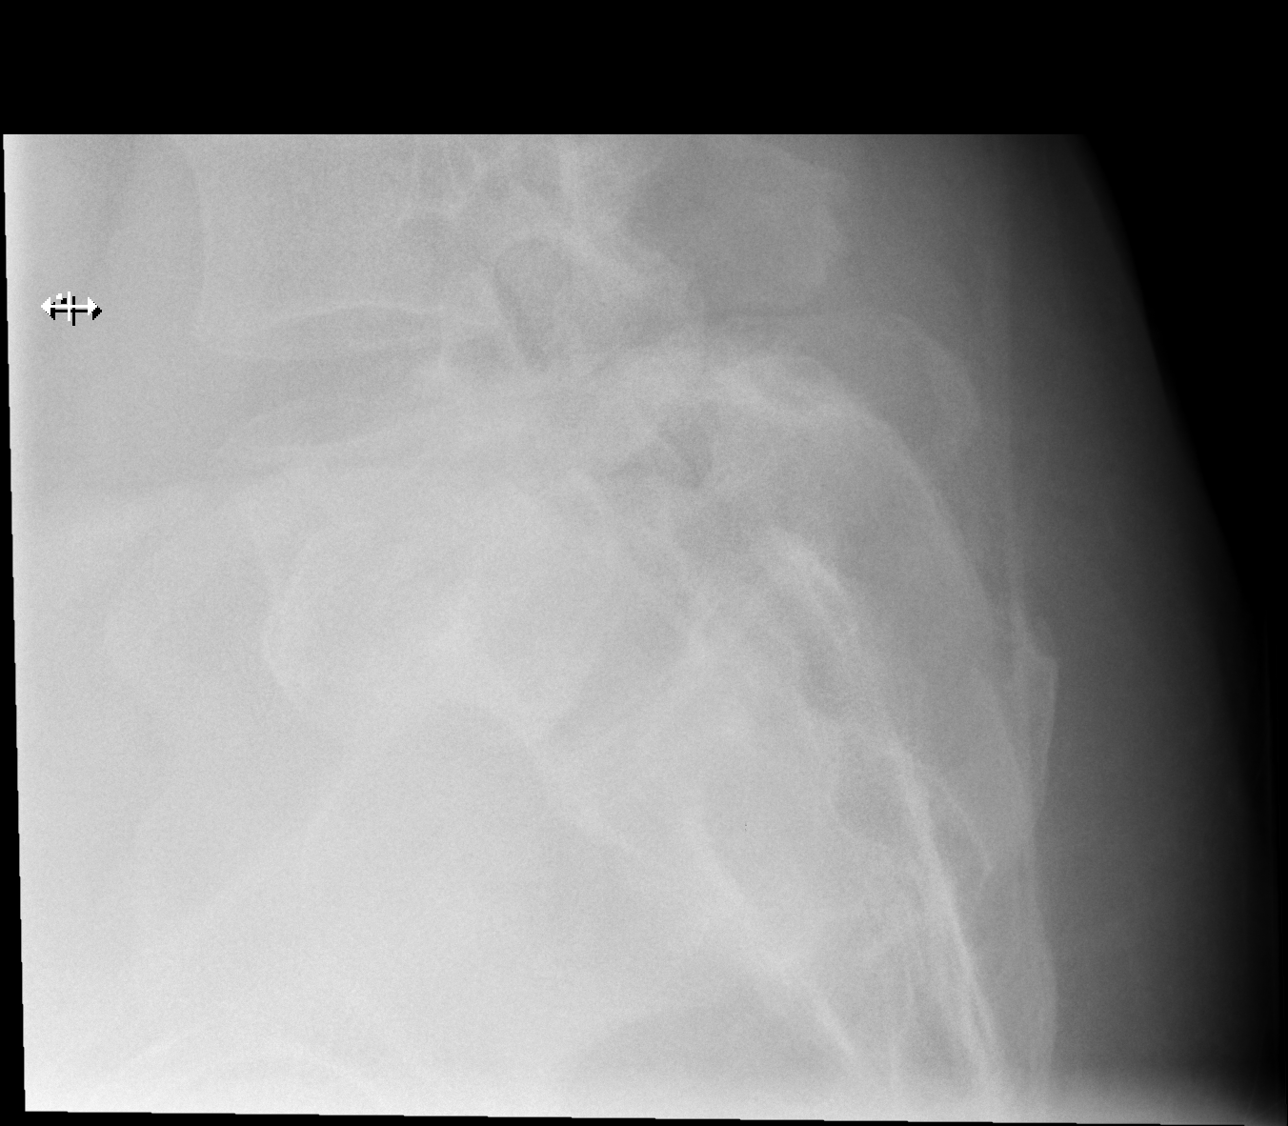

[5 of 5 positions shown; findings below may reference images not displayed]

FINDINGS: Grade 1 to grade 2 anterolisthesis of L5 on S1 where there is also
moderate-to-marked loss of disc height. Facet degenerative changes
noted bilaterally at this level.

No other spondylolisthesis. Remaining disc spaces are relatively
well preserved.

L5 is a transitional lumbosacral vertebrae.

Soft tissues are unremarkable.
IMPRESSION: 1. No fracture or acute finding.
2. Disc and facet degenerative changes at L5-S1 with a grade 1 to
grade 2 anterolisthesis.
